# Patient Record
Sex: Female | Born: 1970 | Race: Black or African American | Hispanic: No | Marital: Married | Smoking: Never smoker
Health system: Southern US, Community
[De-identification: ages and names within clinical notes are randomized; demographics above are authoritative.]

## PROBLEM LIST (undated history)

## (undated) DIAGNOSIS — N921 Excessive and frequent menstruation with irregular cycle: Secondary | ICD-10-CM

## (undated) DIAGNOSIS — D259 Leiomyoma of uterus, unspecified: Secondary | ICD-10-CM

## (undated) DIAGNOSIS — D62 Acute posthemorrhagic anemia: Secondary | ICD-10-CM

## (undated) DIAGNOSIS — E119 Type 2 diabetes mellitus without complications: Secondary | ICD-10-CM

## (undated) DIAGNOSIS — D219 Benign neoplasm of connective and other soft tissue, unspecified: Secondary | ICD-10-CM

## (undated) DIAGNOSIS — E785 Hyperlipidemia, unspecified: Secondary | ICD-10-CM

## (undated) DIAGNOSIS — Z9289 Personal history of other medical treatment: Secondary | ICD-10-CM

## (undated) DIAGNOSIS — I10 Essential (primary) hypertension: Secondary | ICD-10-CM

## (undated) DIAGNOSIS — D649 Anemia, unspecified: Secondary | ICD-10-CM

## (undated) HISTORY — DX: Leiomyoma of uterus, unspecified: D25.9

## (undated) HISTORY — DX: Excessive and frequent menstruation with irregular cycle: N92.1

## (undated) HISTORY — DX: Hyperlipidemia, unspecified: E78.5

## (undated) HISTORY — DX: Benign neoplasm of connective and other soft tissue, unspecified: D21.9

## (undated) HISTORY — DX: Essential (primary) hypertension: I10

## (undated) HISTORY — DX: Personal history of other medical treatment: Z92.89

## (undated) HISTORY — DX: Anemia, unspecified: D64.9

## (undated) HISTORY — DX: Type 2 diabetes mellitus without complications: E11.9

## (undated) HISTORY — DX: Acute posthemorrhagic anemia: D62

---

## 2006-11-29 ENCOUNTER — Emergency Department: Payer: Self-pay | Admitting: Emergency Medicine

## 2013-10-06 ENCOUNTER — Inpatient Hospital Stay: Payer: Self-pay | Admitting: Internal Medicine

## 2013-10-06 LAB — URINALYSIS, COMPLETE
Bacteria: NONE SEEN
Bilirubin,UR: NEGATIVE
GLUCOSE, UR: NEGATIVE mg/dL (ref 0–75)
Hyaline Cast: 2
KETONE: NEGATIVE
Leukocyte Esterase: NEGATIVE
Nitrite: NEGATIVE
PH: 6 (ref 4.5–8.0)
Protein: NEGATIVE
RBC,UR: <1 /HPF (ref 0–5)
Specific Gravity: 1.014 (ref 1.003–1.030)

## 2013-10-06 LAB — COMPREHENSIVE METABOLIC PANEL
AST: 15 U/L (ref 15–37)
Albumin: 3.3 g/dL — ABNORMAL LOW (ref 3.4–5.0)
Alkaline Phosphatase: 77 U/L
Anion Gap: 5 — ABNORMAL LOW (ref 7–16)
BUN: 7 mg/dL (ref 7–18)
Bilirubin,Total: 0.4 mg/dL (ref 0.2–1.0)
Calcium, Total: 8.3 mg/dL — ABNORMAL LOW (ref 8.5–10.1)
Chloride: 102 mmol/L (ref 98–107)
Co2: 27 mmol/L (ref 21–32)
Creatinine: 1.05 mg/dL (ref 0.60–1.30)
EGFR (African American): >60
Glucose: 100 mg/dL — ABNORMAL HIGH (ref 65–99)
OSMOLALITY: 266 (ref 275–301)
Potassium: 3.1 mmol/L — ABNORMAL LOW (ref 3.5–5.1)
SGPT (ALT): 13 U/L (ref 12–78)
Sodium: 134 mmol/L — ABNORMAL LOW (ref 136–145)
TOTAL PROTEIN: 8.4 g/dL — AB (ref 6.4–8.2)

## 2013-10-06 LAB — IRON AND TIBC
Iron Bind.Cap.(Total): 430 ug/dL (ref 250–450)
Iron Saturation: 3 %
Iron: 14 ug/dL — ABNORMAL LOW (ref 50–170)
UNBOUND IRON-BIND. CAP.: 416 ug/dL

## 2013-10-06 LAB — CBC WITH DIFFERENTIAL/PLATELET
BASOS ABS: 0.1 10*3/uL (ref 0.0–0.1)
Basophil %: 1.7 %
Eosinophil #: 0 10*3/uL (ref 0.0–0.7)
Eosinophil %: 0.3 %
HCT: 13.3 % — AB (ref 35.0–47.0)
HGB: 3.7 g/dL — AB (ref 12.0–16.0)
LYMPHS PCT: 28.2 %
Lymphocyte #: 1.2 10*3/uL (ref 1.0–3.6)
MCH: 15.4 pg — ABNORMAL LOW (ref 26.0–34.0)
MCHC: 28.1 g/dL — ABNORMAL LOW (ref 32.0–36.0)
MCV: 55 fL — ABNORMAL LOW (ref 80–100)
Monocyte #: 0.6 x10 3/mm (ref 0.2–0.9)
Monocyte %: 12.7 %
NEUTROS PCT: 57.1 %
Neutrophil #: 2.5 10*3/uL (ref 1.4–6.5)
Platelet: 533 10*3/uL — ABNORMAL HIGH (ref 150–440)
RBC: 2.43 10*6/uL — ABNORMAL LOW (ref 3.80–5.20)
RDW: 21.5 % — ABNORMAL HIGH (ref 11.5–14.5)
WBC: 4.4 10*3/uL (ref 3.6–11.0)

## 2013-10-06 LAB — FERRITIN: FERRITIN (ARMC): 2 ng/mL — AB (ref 8–388)

## 2013-10-06 LAB — MAGNESIUM: Magnesium: 1.6 mg/dL — ABNORMAL LOW

## 2013-10-07 LAB — CBC WITH DIFFERENTIAL/PLATELET
BASOS ABS: 0.1 10*3/uL (ref 0.0–0.1)
Basophil #: 0.1 10*3/uL (ref 0.0–0.1)
Basophil %: 2.5 %
Basophil %: 1.5 %
EOS ABS: 0 10*3/uL (ref 0.0–0.7)
Eosinophil #: 0 10*3/uL (ref 0.0–0.7)
Eosinophil %: 0.6 %
Eosinophil %: 0.9 %
HCT: 17 % — AB (ref 35.0–47.0)
HCT: 21.5 % — ABNORMAL LOW (ref 35.0–47.0)
HGB: 5.2 g/dL — ABNORMAL LOW (ref 12.0–16.0)
HGB: 6.5 g/dL — ABNORMAL LOW (ref 12.0–16.0)
LYMPHS PCT: 34.9 %
Lymphocyte #: 1.4 10*3/uL (ref 1.0–3.6)
Lymphocyte #: 1.9 10*3/uL (ref 1.0–3.6)
Lymphocyte %: 38.3 %
MCH: 20.3 pg — AB (ref 26.0–34.0)
MCH: 20.4 pg — ABNORMAL LOW (ref 26.0–34.0)
MCHC: 30.4 g/dL — ABNORMAL LOW (ref 32.0–36.0)
MCHC: 30.5 g/dL — ABNORMAL LOW (ref 32.0–36.0)
MCV: 67 fL — ABNORMAL LOW (ref 80–100)
MCV: 67 fL — ABNORMAL LOW (ref 80–100)
MONO ABS: 0.5 x10 3/mm (ref 0.2–0.9)
MONO ABS: 0.5 x10 3/mm (ref 0.2–0.9)
Monocyte %: 13.6 %
Monocyte %: 9.5 %
NEUTROS ABS: 3 10*3/uL (ref 1.4–6.5)
NEUTROS PCT: 45 %
NEUTROS PCT: 53.2 %
Neutrophil #: 1.6 10*3/uL (ref 1.4–6.5)
Platelet: 460 10*3/uL — ABNORMAL HIGH (ref 150–440)
Platelet: 384 10*3/uL (ref 150–440)
RBC: 2.55 10*6/uL — AB (ref 3.80–5.20)
RBC: 3.21 10*6/uL — ABNORMAL LOW (ref 3.80–5.20)
RDW: 30.5 % — ABNORMAL HIGH (ref 11.5–14.5)
RDW: 32 % — ABNORMAL HIGH (ref 11.5–14.5)
WBC: 3.6 10*3/uL (ref 3.6–11.0)
WBC: 5.6 10*3/uL (ref 3.6–11.0)

## 2013-10-07 LAB — MAGNESIUM: Magnesium: 1.8 mg/dL

## 2013-10-08 LAB — HEMOGLOBIN: HGB: 7.3 g/dL — ABNORMAL LOW (ref 12.0–16.0)

## 2013-10-15 ENCOUNTER — Ambulatory Visit: Payer: Self-pay | Admitting: Internal Medicine

## 2013-10-15 LAB — POTASSIUM: Potassium: 3.8 mmol/L (ref 3.5–5.1)

## 2013-10-15 LAB — CANCER CENTER HEMOGLOBIN: HGB: 10 g/dL — ABNORMAL LOW (ref 12.0–16.0)

## 2013-10-18 LAB — PROT IMMUNOELECTROPHORES(ARMC)

## 2013-10-28 LAB — CANCER CENTER HEMOGLOBIN: HGB: 10.6 g/dL — ABNORMAL LOW (ref 12.0–16.0)

## 2013-10-29 ENCOUNTER — Ambulatory Visit: Payer: Self-pay | Admitting: Internal Medicine

## 2013-11-11 LAB — FERRITIN: FERRITIN (ARMC): 8 ng/mL (ref 8–388)

## 2013-11-11 LAB — HEMOGLOBIN: HGB: 9.9 g/dL — ABNORMAL LOW (ref 12.0–16.0)

## 2013-11-15 LAB — PROT IMMUNOELECTROPHORES(ARMC)

## 2013-11-15 LAB — KAPPA/LAMBDA FREE LIGHT CHAINS (ARMC)

## 2013-11-25 LAB — CBC CANCER CENTER
BASOS PCT: 0.9 %
Basophil #: 0.1 x10 3/mm (ref 0.0–0.1)
EOS ABS: 0.3 x10 3/mm (ref 0.0–0.7)
Eosinophil %: 4.9 %
HCT: 34.8 % — AB (ref 35.0–47.0)
HGB: 11.3 g/dL — ABNORMAL LOW (ref 12.0–16.0)
LYMPHS PCT: 34.5 %
Lymphocyte #: 2.4 x10 3/mm (ref 1.0–3.6)
MCH: 27.6 pg (ref 26.0–34.0)
MCHC: 32.4 g/dL (ref 32.0–36.0)
MCV: 85 fL (ref 80–100)
Monocyte #: 0.4 x10 3/mm (ref 0.2–0.9)
Monocyte %: 5.8 %
NEUTROS PCT: 53.9 %
Neutrophil #: 3.7 x10 3/mm (ref 1.4–6.5)
PLATELETS: 387 x10 3/mm (ref 150–440)
RBC: 4.07 10*6/uL (ref 3.80–5.20)
RDW: 20 % — ABNORMAL HIGH (ref 11.5–14.5)
WBC: 6.8 x10 3/mm (ref 3.6–11.0)

## 2013-11-25 LAB — CALCIUM: Calcium, Total: 8 mg/dL — ABNORMAL LOW (ref 8.5–10.1)

## 2013-11-25 LAB — CREATININE, SERUM
Creatinine: 0.77 mg/dL (ref 0.60–1.30)
EGFR (Non-African Amer.): >60

## 2013-11-29 ENCOUNTER — Ambulatory Visit: Payer: Self-pay | Admitting: Internal Medicine

## 2013-11-29 LAB — PROT IMMUNOELECTROPHORES(ARMC)

## 2013-11-29 LAB — KAPPA/LAMBDA FREE LIGHT CHAINS (ARMC)

## 2013-12-16 LAB — URINALYSIS, COMPLETE
BACTERIA: NONE SEEN
BILIRUBIN, UR: NEGATIVE
BLOOD: NEGATIVE
Glucose,UR: NEGATIVE mg/dL (ref 0–75)
Ketone: NEGATIVE
Leukocyte Esterase: NEGATIVE
Nitrite: NEGATIVE
PH: 6 (ref 4.5–8.0)
Protein: NEGATIVE
Specific Gravity: 1.02 (ref 1.003–1.030)

## 2013-12-16 LAB — OCCULT BLOOD X 1 CARD TO LAB, STOOL
OCCULT BLOOD, FECES: NEGATIVE
Occult Blood, Feces: NEGATIVE
Occult Blood, Feces: NEGATIVE

## 2013-12-16 LAB — CALCIUM: Calcium, Total: 8.8 mg/dL (ref 8.5–10.1)

## 2013-12-16 LAB — HCG, QUANTITATIVE, PREGNANCY: Beta Hcg, Quant.: <1 m[IU]/mL — ABNORMAL LOW

## 2013-12-16 LAB — CANCER CENTER HEMOGLOBIN: HGB: 11.5 g/dL — ABNORMAL LOW (ref 12.0–16.0)

## 2013-12-20 LAB — PROT IMMUNOELECTROPHORES(ARMC)

## 2013-12-29 ENCOUNTER — Ambulatory Visit: Payer: Self-pay | Admitting: Internal Medicine

## 2014-01-05 LAB — CBC CANCER CENTER
BASOS PCT: 1.1 %
Basophil #: 0.1 x10 3/mm (ref 0.0–0.1)
EOS ABS: 0.3 x10 3/mm (ref 0.0–0.7)
Eosinophil %: 3.7 %
HCT: 37.1 % (ref 35.0–47.0)
HGB: 12.1 g/dL (ref 12.0–16.0)
LYMPHS PCT: 32.6 %
Lymphocyte #: 2.6 x10 3/mm (ref 1.0–3.6)
MCH: 28.9 pg (ref 26.0–34.0)
MCHC: 32.5 g/dL (ref 32.0–36.0)
MCV: 89 fL (ref 80–100)
Monocyte #: 0.5 x10 3/mm (ref 0.2–0.9)
Monocyte %: 6 %
Neutrophil #: 4.5 x10 3/mm (ref 1.4–6.5)
Neutrophil %: 56.6 %
Platelet: 337 x10 3/mm (ref 150–440)
RBC: 4.17 10*6/uL (ref 3.80–5.20)
RDW: 14 % (ref 11.5–14.5)
WBC: 8 x10 3/mm (ref 3.6–11.0)

## 2014-01-29 ENCOUNTER — Ambulatory Visit: Payer: Self-pay | Admitting: Internal Medicine

## 2014-03-01 ENCOUNTER — Ambulatory Visit: Payer: Self-pay | Admitting: Internal Medicine

## 2014-06-21 ENCOUNTER — Emergency Department: Payer: Self-pay | Admitting: Emergency Medicine

## 2014-06-29 ENCOUNTER — Ambulatory Visit: Payer: Self-pay | Admitting: Internal Medicine

## 2014-06-29 ENCOUNTER — Emergency Department: Payer: Self-pay | Admitting: Emergency Medicine

## 2014-07-04 ENCOUNTER — Ambulatory Visit: Payer: Self-pay | Admitting: Internal Medicine

## 2014-07-04 LAB — CBC CANCER CENTER
BASOS ABS: 0.1 x10 3/mm (ref 0.0–0.1)
Basophil %: 0.9 %
Eosinophil #: 0.5 x10 3/mm (ref 0.0–0.7)
Eosinophil %: 4.5 %
HCT: 33.4 % — AB (ref 35.0–47.0)
HGB: 10.6 g/dL — AB (ref 12.0–16.0)
Lymphocyte #: 3.5 x10 3/mm (ref 1.0–3.6)
Lymphocyte %: 31.3 %
MCH: 27.7 pg (ref 26.0–34.0)
MCHC: 31.8 g/dL — AB (ref 32.0–36.0)
MCV: 87 fL (ref 80–100)
MONO ABS: 0.7 x10 3/mm (ref 0.2–0.9)
Monocyte %: 6.2 %
NEUTROS ABS: 6.3 x10 3/mm (ref 1.4–6.5)
Neutrophil %: 57.1 %
Platelet: 567 x10 3/mm — ABNORMAL HIGH (ref 150–440)
RBC: 3.84 10*6/uL (ref 3.80–5.20)
RDW: 15.8 % — AB (ref 11.5–14.5)
WBC: 11 x10 3/mm (ref 3.6–11.0)

## 2014-07-04 LAB — CALCIUM: Calcium, Total: 8.8 mg/dL (ref 8.5–10.1)

## 2014-07-04 LAB — CREATININE, SERUM
CREATININE: 0.83 mg/dL (ref 0.60–1.30)
EGFR (African American): >60
EGFR (Non-African Amer.): >60

## 2014-07-05 LAB — KAPPA/LAMBDA FREE LIGHT CHAINS (ARMC)

## 2014-07-05 LAB — PROT IMMUNOELECTROPHORES(ARMC)

## 2014-08-01 ENCOUNTER — Ambulatory Visit: Payer: Self-pay | Admitting: Internal Medicine

## 2014-08-30 ENCOUNTER — Ambulatory Visit
Admit: 2014-08-30 | Disposition: A | Discharge status: Home / Self Care | Payer: Self-pay | Attending: Internal Medicine | Admitting: Internal Medicine

## 2014-09-23 LAB — CBC CANCER CENTER
BASOS PCT: 0.9 %
Basophil #: 0.1 x10 3/mm (ref 0.0–0.1)
EOS PCT: 4.3 %
Eosinophil #: 0.4 x10 3/mm (ref 0.0–0.7)
HCT: 33.2 % — ABNORMAL LOW (ref 35.0–47.0)
HGB: 10.6 g/dL — AB (ref 12.0–16.0)
LYMPHS PCT: 25.8 %
Lymphocyte #: 2.2 x10 3/mm (ref 1.0–3.6)
MCH: 27.8 pg (ref 26.0–34.0)
MCHC: 31.9 g/dL — ABNORMAL LOW (ref 32.0–36.0)
MCV: 87 fL (ref 80–100)
Monocyte #: 0.5 x10 3/mm (ref 0.2–0.9)
Monocyte %: 6.2 %
Neutrophil #: 5.4 x10 3/mm (ref 1.4–6.5)
Neutrophil %: 62.8 %
Platelet: 420 x10 3/mm (ref 150–440)
RBC: 3.81 10*6/uL (ref 3.80–5.20)
RDW: 16.4 % — ABNORMAL HIGH (ref 11.5–14.5)
WBC: 8.5 x10 3/mm (ref 3.6–11.0)

## 2014-09-23 LAB — IRON AND TIBC
IRON BIND. CAP.(TOTAL): 317 (ref 250–450)
IRON SATURATION: 6
Iron: 19 ug/dL — ABNORMAL LOW
UNBOUND IRON-BIND. CAP.: 297.5

## 2014-09-26 LAB — PROT IMMUNOELECTROPHORES(ARMC)

## 2014-09-30 ENCOUNTER — Ambulatory Visit
Admit: 2014-09-30 | Disposition: A | Discharge status: Home / Self Care | Payer: Self-pay | Attending: Internal Medicine | Admitting: Internal Medicine

## 2014-10-22 NOTE — H&P (Signed)
PATIENT NAME:  Barbara Juarez, VENSEL MR#:  [id number] DATE OF BIRTH:  1971/03/24  DATE OF ADMISSION:  10/06/2013  REFERRING PHYSICIAN:  Dr. Thomasene Lot.  PRIMARY CARE PHYSICIAN:  Dr. Burt Ek.   CHIEF COMPLAINT: Nausea, vomiting.   A 44 year old [place] American female without significant past medical history, presenting with nausea and vomiting. She describes a 2 to 3 day duration of nausea and vomiting, nonbloody, nonbilious emesis with associated decreased p.o. intake. She, however, denies any abdominal pain or diarrhea or constipation, change in bowel habits. Denies any bright red blood per rectum or melena. She has had associated nonproductive cough with postnasal drip for the same 2 to 3 day duration. 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 She had presented today as she had worsening of symptoms with associated lightheadedness. When going from sitting to standing, required her to  sit back down. She also has dyspnea on exertion. Denies any further symptoms. Currently without complaints.   REVIEW OF SYSTEMS:  CONSTITUTIONAL: Positive for subjective fevers, chills, fatigue, weakness.  EYES: Denied blurred vision, double vision, eye pain.  EARS, NOSE, THROAT: Denies tinnitus, ear pain. Positive for postnasal drip.  RESPIRATORY: Positive for cough as described above. Denies wheeze or shortness of breath.  CARDIOVASCULAR: Denies chest pain, palpitations, edema.  GASTROINTESTINAL: Denies abdominal pain. Positive for nausea, vomiting. Denies melena, hematochezia.  GENITOURINARY: Denies dysuria or hematuria.  ENDOCRINE: Denies nocturia or thyroid problems.  HEMATOLOGIC AND LYMPHATIC: Denies easy bruising, bleeding or swollen glands.  SKIN: Denies rash or lesion.  MUSCULOSKELETAL: Denies pain in neck, back, shoulder, knees, hips or arthritic symptoms.  NEUROLOGIC: Denies paralysis, paresthesias.  PSYCHIATRIC:  Denies anxiety or depressive symptoms. Otherwise, full review of systems performed by me is negative.   PAST MEDICAL HISTORY: None.   SOCIAL HISTORY: Occasional alcohol. Denies any tobacco or drug usage.   FAMILY HISTORY: Denies any known bleeding disorders or sickle cell.   ALLERGIES: No known drug allergies.   HOME MEDICATIONS: None.   PHYSICAL EXAMINATION: VITAL SIGNS: Temperature 100, heart rate 112, respirations 22, blood pressure 140/61, saturating 99% on supplemental O2. Weight 122.5 kg. BMI 43.6.  GENERAL: Well-nourished, well-developed [place] American female, currently in no acute distress.   HEAD:  Normocephalic, atraumatic. EYES:  Pupils equal, round and reactive to light.  Extraocular muscles intact. No scleral icterus. Pale conjunctivae.  MOUTH: Dry mucosal membranes. Dentition intact. No abscess noted.  EARS, NOSE, THROAT: Clear without exudates. No external lesions.  NECK: Supple. No thyromegaly. No nodules. No JVD.  PULMONARY: Clear to auscultation bilaterally with no wheeze, rubs or rhonchi. No use of accessory muscles. Good respiratory effort.  CHEST: Nontender to palpation.  CARDIOVASCULAR: S1, S2, tachycardic. No murmurs, rubs or gallops. No edema. Pedal pulses 2+ bilaterally.  GASTROINTESTINAL: Soft, nontender, nondistended. No masses. Positive bowel sounds. No hepatosplenomegaly. Fecal occult testing is negative.  MUSCULOSKELETAL: No swelling, clubbing, edema. Range of motion full in all extremities.  NEUROLOGIC: Cranial nerves II through XII intact. No gross focal neurological deficits. Sensation intact.  Reflexes intact.  SKIN: No ulcerations, lesions, rash, cyanosis. Skin warm, dry. Turgor intact.  PSYCHIATRIC: Mood and affect within normal limits. The patient is awake, alert and oriented x 3. Insight and judgment intact.   LABORATORY DATA: Chest x-ray performed revealing no acute cardiopulmonary process. Sodium 134, potassium 3.1, chloride 102, bicarb 27, BUN  7, creatinine 1.05, glucose 100. LFTs:, albumin 3.3; remainder within normal limits.  WBC 4.4, hemoglobin 3.7, platelets of 533, MCV 55, RDW 21.5. Urinalysis is negative for evidence of infection.   ASSESSMENT AND PLAN: A 44 year old African American female without significant history presenting with nausea and vomiting.  1. Symptomatic microcytic anemia with hemoglobin of 3.7, MCV of 55, RDW of 21. We will check iron studies. I suspect this is likely related iron deficiency anemia, as she does state she has heavy menstrual periods lasting 5 to 7 days with frank blood clots and going through multiple tampons daily, too many to count.  Regardless, we will transfuse 2 units now as ordered by the Emergency Department, and check repeat CBC after the transfusion and repeat transfusions with goal hemoglobin greater than 7.   2.  Systemic inflammatory response syndrome, meeting SIRS criteria by tachycardia and temperature. No gross evidence of bacterial infections at this time. I suspect this is likely viral in nature. We will hold on any antibiotics and provide supportive care including IV fluids and Tylenol as required.  3.  Gastroenteritis. Supportive care, IV fluid and Zofran as required.  4.  Hypokalemia. Replace potassium to goal of 4 to 5.  5.  Venous thromboembolism prophylaxis with sequential compression devices.   The patient is FULL CODE.   TIME SPENT: 45 minutes.    ____________________________ Aaron Mose. Hower, MD dkh:dmm D: 10/06/2013 21:31:40 ET T: 10/06/2013 22:06:44 ET JOB#: [id number]  cc: Aaron Mose. Hower, MD, <Dictator> DAVID Woodfin Ganja MD ELECTRONICALLY SIGNED 10/07/2013 13:47

## 2014-10-22 NOTE — Discharge Summary (Signed)
PATIENT NAME:  Barbara Juarez, OZGA MR#:  [id number] DATE OF BIRTH:  [birth year]  DATE OF ADMISSION:  10/06/2013  DATE OF DISCHARGE:  10/08/2013  ADMISSION DIAGNOSIS: Symptomatic anemia.   DISCHARGE DIAGNOSES:  1.  Symptomatic anemia, most likely secondary to heavy menses.  2.  Viral gastroenteritis with nausea, vomiting and fever.   CONSULTATIONS: None.   PERTINENT LABORATORIES AT DISCHARGE:  Hemoglobin of 7.3 Admission hemoglobin 3.7.   TIBC 430, UIBC 416, iron 14, saturation 3, ferritin 2, magnesium 1.6.   HOSPITAL COURSE:  This is a 44 year old female who is admitted for symptomatic anemia. She was found to have a hemoglobin of 3. For further details, please see H and P.   ASSESSMENT AND PLAN: 1.  Symptomatic anemia, suspected from heavy menses as per the history of the patient. She has iron studies consistent with iron deficiency anemia. She received 5units of PRBCs total during this hospitalization and recommend iron supplementation. Referral to OB-GYN and hematology as an outpatient.  2.  Tachycardia and fever on admission from viral gastroenteritis and anemia.  3.  Gastroenteritis. The patient was on supportive care.  4. enlarged heart on CXR she needs outpatietn follow up for this.  DISCHARGE MEDICATIONS:  Ferrous sulfate 325 mg b.i.d.   DISCHARGE DIET: Regular.   DISCHARGE ACTIVITY: As tolerated.   FOLLOW UP:  Patient will follow up with [facility] in 1-2 weeks as well as oncology.   TIME SPENT: Approximately 35 minutes.   DISPOSITION: The patient medically stable for discharge.    ____________________________ Florice Hindle P. Benjie Karvonen, MD spm:tc D: 10/07/2013 14:22:13 ET T: 10/07/2013 14:42:22 ET JOB#: [id number]  cc: Celestine Prim P. Benjie Karvonen, MD, <Dictator>            [facility] Zara Wendt P Analyah Mcconnon MD ELECTRONICALLY SIGNED 10/08/2013 12:46

## 2014-12-21 ENCOUNTER — Inpatient Hospital Stay: Payer: Self-pay | Attending: Family Medicine

## 2014-12-21 ENCOUNTER — Inpatient Hospital Stay: Payer: Self-pay | Admitting: Family Medicine

## 2015-02-03 ENCOUNTER — Other Ambulatory Visit: Payer: Self-pay | Admitting: Family Medicine

## 2015-02-08 ENCOUNTER — Other Ambulatory Visit: Payer: Self-pay

## 2015-02-08 DIAGNOSIS — D500 Iron deficiency anemia secondary to blood loss (chronic): Secondary | ICD-10-CM | POA: Insufficient documentation

## 2015-02-08 DIAGNOSIS — D649 Anemia, unspecified: Secondary | ICD-10-CM

## 2015-02-09 ENCOUNTER — Inpatient Hospital Stay: Payer: Self-pay

## 2015-02-09 ENCOUNTER — Inpatient Hospital Stay: Payer: Self-pay | Admitting: Family Medicine

## 2015-02-13 ENCOUNTER — Other Ambulatory Visit: Payer: Self-pay | Admitting: Family Medicine

## 2015-02-13 DIAGNOSIS — D509 Iron deficiency anemia, unspecified: Secondary | ICD-10-CM

## 2015-02-22 ENCOUNTER — Inpatient Hospital Stay: Payer: Self-pay

## 2015-02-22 ENCOUNTER — Inpatient Hospital Stay: Payer: Self-pay | Attending: Family Medicine | Admitting: Family Medicine

## 2015-02-22 ENCOUNTER — Other Ambulatory Visit: Payer: Self-pay | Admitting: Family Medicine

## 2015-02-22 DIAGNOSIS — D472 Monoclonal gammopathy: Secondary | ICD-10-CM | POA: Insufficient documentation

## 2015-02-22 DIAGNOSIS — D509 Iron deficiency anemia, unspecified: Secondary | ICD-10-CM

## 2015-02-22 DIAGNOSIS — Z79899 Other long term (current) drug therapy: Secondary | ICD-10-CM | POA: Insufficient documentation

## 2015-02-22 DIAGNOSIS — N946 Dysmenorrhea, unspecified: Secondary | ICD-10-CM | POA: Insufficient documentation

## 2015-02-22 DIAGNOSIS — D649 Anemia, unspecified: Secondary | ICD-10-CM

## 2015-02-22 LAB — CBC
HCT: 30.1 % — ABNORMAL LOW (ref 35.0–47.0)
HEMOGLOBIN: 9.2 g/dL — AB (ref 12.0–16.0)
MCH: 24.3 pg — ABNORMAL LOW (ref 26.0–34.0)
MCHC: 30.7 g/dL — ABNORMAL LOW (ref 32.0–36.0)
MCV: 79.2 fL — ABNORMAL LOW (ref 80.0–100.0)
Platelets: 514 10*3/uL — ABNORMAL HIGH (ref 150–440)
RBC: 3.8 MIL/uL (ref 3.80–5.20)
RDW: 18.3 % — AB (ref 11.5–14.5)
WBC: 7.8 10*3/uL (ref 3.6–11.0)

## 2015-02-22 LAB — IRON AND TIBC
IRON: 16 ug/dL — AB (ref 28–170)
Saturation Ratios: 4 % — ABNORMAL LOW (ref 10.4–31.8)
TIBC: 361 ug/dL (ref 250–450)
UIBC: 345 ug/dL

## 2015-02-22 LAB — FERRITIN: FERRITIN: 8 ng/mL — AB (ref 11–307)

## 2015-02-22 NOTE — Progress Notes (Signed)
Patient here today for follow up for anemia. Former patient of Dr. Inez Pilgrim.  Offers no complaints today.

## 2015-02-23 ENCOUNTER — Other Ambulatory Visit: Payer: Self-pay | Admitting: Family Medicine

## 2015-02-23 LAB — PROTEIN ELECTROPHORESIS, SERUM
A/G Ratio: 0.9 (ref 0.7–1.7)
ALPHA-1-GLOBULIN: 0.2 g/dL (ref 0.0–0.4)
Albumin ELP: 3.5 g/dL (ref 2.9–4.4)
Alpha-2-Globulin: 0.6 g/dL (ref 0.4–1.0)
BETA GLOBULIN: 1.3 g/dL (ref 0.7–1.3)
GAMMA GLOBULIN: 1.9 g/dL — AB (ref 0.4–1.8)
Globulin, Total: 4 g/dL — ABNORMAL HIGH (ref 2.2–3.9)
M-Spike, %: 0.8 g/dL — ABNORMAL HIGH
TOTAL PROTEIN ELP: 7.5 g/dL (ref 6.0–8.5)

## 2015-02-23 NOTE — Progress Notes (Signed)
Barbara Juarez  Telephone:([phone]) [phone]  Fax:(336) [place] DOB: 04/25/1971  MR#: [id number]  OQH#:[id number]  Date of service 02/22/2015.  Patient Care Team: Shepard General, MD as PCP - General (General Practice)  CHIEF COMPLAINT:  Chief Complaint  Patient presents with  . Follow-up    Patient with significant past medical history of iron deficiency anemia, mild cardiomegaly, pulmonary congestion, and a monoclonal gammopathy.  INTERVAL HISTORY: Patient is here for further evaluation and treatment consideration regarding iron deficiency anemia. She reports continuing with her ferrous sulfate 3 times per day orally. The patient was initially evaluated in April following recent hospitalization. She was a previous patient of Dr. Marylene Land. She is established and followed by Dolores Frame for dysmenorrhea. She reports most recent menses was 2 weeks ago and was very heavy. She overall feels well and denies any acute complaints today.  REVIEW OF SYSTEMS:   Review of Systems  Constitutional: Negative for fever, chills, weight loss, malaise/fatigue and diaphoresis.  HENT: Negative for congestion, ear discharge, ear pain, hearing loss, nosebleeds, sore throat and tinnitus.   Eyes: Negative for blurred vision, double vision, photophobia, pain, discharge and redness.  Respiratory: Negative for cough, hemoptysis, sputum production, shortness of breath, wheezing and stridor.   Cardiovascular: Negative for chest pain, palpitations, orthopnea, claudication, leg swelling and PND.  Gastrointestinal: Negative for heartburn, nausea, vomiting, abdominal pain, diarrhea, constipation, blood in stool and melena.  Genitourinary: Negative.   Musculoskeletal: Negative.   Skin: Negative.   Neurological: Negative for dizziness, tingling, focal weakness, seizures, weakness and headaches.  Endo/Heme/Allergies: Does not bruise/bleed easily.  Psychiatric/Behavioral: Negative for  depression. The patient is not nervous/anxious and does not have insomnia.     As per HPI. Otherwise, a complete review of systems is negatve.  ONCOLOGY HISTORY:  No history exists.    PAST MEDICAL HISTORY: No past medical history on file.  PAST SURGICAL HISTORY: No past surgical history on file.  FAMILY HISTORY No family history on file.  GYNECOLOGIC HISTORY:  No LMP recorded.     ADVANCED DIRECTIVES:    HEALTH MAINTENANCE: Social History  Substance Use Topics  . Smoking status: Not on file  . Smokeless tobacco: Not on file  . Alcohol Use: Not on file     Colonoscopy:  PAP:  Bone density:  Lipid panel:  No Known Allergies  Current Outpatient Prescriptions  Medication Sig Dispense Refill  . ferrous sulfate 325 (65 FE) MG EC tablet Take 325 mg by mouth 3 (three) times daily with meals.     No current facility-administered medications for this visit.    OBJECTIVE: BP 153/99 mmHg  Pulse 83  Temp(Src) 96.8 F (36 C) (Tympanic)  Wt 293 lb 6.9 oz (133.1 kg)   There is no height on file to calculate BMI.    ECOG FS:0 - Asymptomatic  General: Well-developed, well-nourished, no acute distress. Eyes: Pink conjunctiva, anicteric sclera. HEENT: Normocephalic, moist mucous membranes, clear oropharnyx. Lungs: Clear to auscultation bilaterally. Heart: Regular rate and rhythm. No rubs, murmurs, or gallops. Musculoskeletal: No edema, cyanosis, or clubbing. Neuro: Alert, answering all questions appropriately. Cranial nerves grossly intact. Skin: No rashes or petechiae noted. Psych: Normal affect.    LAB RESULTS:  Office Visit on 02/22/2015  Component Date Value Ref Range Status  . Ferritin 02/22/2015 8* 11 - 307 ng/mL Final  . Iron 02/22/2015 16* 28 - 170 ug/dL Final  . TIBC 02/22/2015 361  250 - 450 ug/dL Final  .  Saturation Ratios 02/22/2015 4* 10.4 - 31.8 % Final  . UIBC 02/22/2015 345   Final  Appointment on 02/22/2015  Component Date Value Ref Range  Status  . WBC 02/22/2015 7.8  3.6 - 11.0 K/uL Final  . RBC 02/22/2015 3.80  3.80 - 5.20 MIL/uL Final  . Hemoglobin 02/22/2015 9.2* 12.0 - 16.0 g/dL Final  . HCT 02/22/2015 30.1* 35.0 - 47.0 % Final  . MCV 02/22/2015 79.2* 80.0 - 100.0 fL Final  . MCH 02/22/2015 24.3* 26.0 - 34.0 pg Final  . MCHC 02/22/2015 30.7* 32.0 - 36.0 g/dL Final  . RDW 02/22/2015 18.3* 11.5 - 14.5 % Final  . Platelets 02/22/2015 514* 150 - 440 K/uL Final    STUDIES: No results found.  ASSESSMENT:  Iron deficiency anemia. Monoclonal gammopathy.  PLAN:  1. IDA. Patient continues with oral iron 3 times per day. Labs reported today with a low ferritin as well as low iron. Hemoglobin 9.2. Discussed with patient and she is agreeable to receiving IV Feraheme. This has been scheduled for 2 doses one week apart. We will have patient return in approximately 8 weeks for labs and continued follow-up. She has been instructed to continue with oral iron as previously scheduled. 2. Monoclonal gammopathy. Most recent SIEP was evaluated in March 2016. Patient was noted to have an M spike of 800. Previously noted M spike of 900 mg. Overall readings are stable, We'll continue to monitor.   Patient expressed understanding and was in agreement with this plan. She also understands that She can call clinic at any time with any questions, concerns, or complaints.   Dr.Finnegan  was available for consultation and review of plan of care for this patient.   Evlyn Kanner, NP   02/23/2015 2:40 PM

## 2015-05-24 ENCOUNTER — Ambulatory Visit: Payer: Self-pay

## 2015-05-24 ENCOUNTER — Other Ambulatory Visit: Payer: Self-pay

## 2015-05-30 ENCOUNTER — Inpatient Hospital Stay: Payer: Self-pay | Attending: Internal Medicine | Admitting: Internal Medicine

## 2015-05-30 ENCOUNTER — Inpatient Hospital Stay: Payer: Self-pay

## 2015-05-30 VITALS — BP 154/108 | Temp 97.2°F | Resp 88 | Ht 62.0 in | Wt 294.5 lb

## 2015-05-30 DIAGNOSIS — D472 Monoclonal gammopathy: Secondary | ICD-10-CM | POA: Insufficient documentation

## 2015-05-30 DIAGNOSIS — D509 Iron deficiency anemia, unspecified: Secondary | ICD-10-CM

## 2015-05-30 DIAGNOSIS — R0989 Other specified symptoms and signs involving the circulatory and respiratory systems: Secondary | ICD-10-CM | POA: Insufficient documentation

## 2015-05-30 DIAGNOSIS — Z1239 Encounter for other screening for malignant neoplasm of breast: Secondary | ICD-10-CM

## 2015-05-30 DIAGNOSIS — Z79899 Other long term (current) drug therapy: Secondary | ICD-10-CM | POA: Insufficient documentation

## 2015-05-30 LAB — CBC WITH DIFFERENTIAL/PLATELET
Basophils Absolute: 0.1 10*3/uL (ref 0–0.1)
Basophils Relative: 1 %
EOS PCT: 5 %
Eosinophils Absolute: 0.5 10*3/uL (ref 0–0.7)
HEMATOCRIT: 27.3 % — AB (ref 35.0–47.0)
HEMOGLOBIN: 8.3 g/dL — AB (ref 12.0–16.0)
LYMPHS ABS: 2.4 10*3/uL (ref 1.0–3.6)
LYMPHS PCT: 26 %
MCH: 23.5 pg — AB (ref 26.0–34.0)
MCHC: 30.4 g/dL — ABNORMAL LOW (ref 32.0–36.0)
MCV: 77.3 fL — AB (ref 80.0–100.0)
Monocytes Absolute: 0.5 10*3/uL (ref 0.2–0.9)
Monocytes Relative: 5 %
NEUTROS ABS: 6 10*3/uL (ref 1.4–6.5)
Neutrophils Relative %: 63 %
PLATELETS: 602 10*3/uL — AB (ref 150–440)
RBC: 3.53 MIL/uL — AB (ref 3.80–5.20)
RDW: 22.1 % — ABNORMAL HIGH (ref 11.5–14.5)
WBC: 9.5 10*3/uL (ref 3.6–11.0)

## 2015-05-30 LAB — IRON AND TIBC
IRON: 71 ug/dL (ref 28–170)
Saturation Ratios: 22 % (ref 10.4–31.8)
TIBC: 317 ug/dL (ref 250–450)
UIBC: 246 ug/dL

## 2015-05-30 LAB — FERRITIN: Ferritin: 10 ng/mL — ABNORMAL LOW (ref 11–307)

## 2015-05-30 NOTE — Progress Notes (Signed)
[place]  Telephone:([phone]  Fax:([phone]) [place] DOB: 06/22/1971  MR#: [id number]  [id number]  Date of service 02/22/2015.  Patient Care Team: Shepard General, MD as PCP - General (General Practice)  CHIEF COMPLAINT:  Chief Complaint  Patient presents with  . IDA    Patient with significant past medical history of iron deficiency anemia, mild cardiomegaly, pulmonary congestion, and a monoclonal gammopathy.  INTERVAL HISTORY: Barbara Juarez  returns to our clinic for a follow-up visit. She continues to have very heavy menstrual bleedings, with most recent one last in 7 days, she had to use 36 tampons over the this period of time. Menstrual bleeding resolved 2 days ago. She claims to be compliant with iron supplementation, which he takes 3 times a day without any significant GI disturbance.  REVIEW OF SYSTEMS:   Review of Systems  Constitutional: Negative for fever, chills, weight loss, malaise/fatigue and diaphoresis.  HENT: Negative for congestion, ear discharge, ear pain, hearing loss, nosebleeds, sore throat and tinnitus.   Eyes: Negative for blurred vision, double vision, photophobia, pain, discharge and redness.  Respiratory: Negative for cough, hemoptysis, sputum production, shortness of breath, wheezing and stridor.   Cardiovascular: Negative for chest pain, palpitations, orthopnea, claudication, leg swelling and PND.  Gastrointestinal: Negative for heartburn, nausea, vomiting, abdominal pain, diarrhea, constipation, blood in stool and melena.  Genitourinary: Negative.   Musculoskeletal: Negative.   Skin: Negative.   Neurological: Negative for dizziness, tingling, focal weakness, seizures, weakness and headaches.  Endo/Heme/Allergies: Does not bruise/bleed easily.  Psychiatric/Behavioral: Negative for depression. The patient is not nervous/anxious and does not have insomnia.     As per HPI. Otherwise, a complete review of  systems is negatve.  ONCOLOGY HISTORY:  No history exists.    PAST MEDICAL HISTORY: History reviewed. No pertinent past medical history.  PAST SURGICAL HISTORY: History reviewed. No pertinent past surgical history.  FAMILY HISTORY Family History  Problem Relation Age of Onset  . Diabetes Mother     GYNECOLOGIC HISTORY:  No LMP recorded.       HEALTH MAINTENANCE: Social History  Substance Use Topics  . Smoking status: Never Smoker   . Smokeless tobacco: None  . Alcohol Use: None     Colonoscopy:  PAP:  Bone density:  Lipid panel:  No Known Allergies  Current Outpatient Prescriptions  Medication Sig Dispense Refill  . ferrous sulfate 325 (65 FE) MG EC tablet Take 325 mg by mouth 3 (three) times daily with meals.     No current facility-administered medications for this visit.    OBJECTIVE: BP 154/108 mmHg  Temp(Src) 97.2 F (36.2 C) (Tympanic)  Resp 88  Ht 5\' 2"  (1.575 m)  Wt 294 lb 8.6 oz (133.6 kg)  BMI 53.86 kg/m2   Body mass index is 53.86 kg/(m^2).    ECOG FS:0 - Asymptomatic  General: Morbidly obese African-American female no acute distress. Eyes: Pink conjunctiva, anicteric sclera. HEENT: Normocephalic, moist mucous membranes, clear oropharnyx. Lungs: Clear to auscultation bilaterally. Heart: Regular rate and rhythm. No rubs, murmurs, or gallops. Musculoskeletal: No edema, cyanosis, or clubbing. Neuro: Alert, answering all questions appropriately. Cranial nerves grossly intact. Skin: No rashes or petechiae noted. Psych: Normal affect.    LAB RESULTS:  Appointment on 05/30/2015  Component Date Value Ref Range Status  . Ferritin 05/30/2015 10* 11 - 307 ng/mL Final  . Iron 05/30/2015 71  28 - 170 ug/dL Final  . TIBC 05/30/2015 317  250 -  450 ug/dL Final  . Saturation Ratios 05/30/2015 22  10.4 - 31.8 % Final  . UIBC 05/30/2015 246   Final  . WBC 05/30/2015 9.5  3.6 - 11.0 K/uL Final  . RBC 05/30/2015 3.53* 3.80 - 5.20 MIL/uL Final  .  Hemoglobin 05/30/2015 8.3* 12.0 - 16.0 g/dL Final  . HCT 05/30/2015 27.3* 35.0 - 47.0 % Final  . MCV 05/30/2015 77.3* 80.0 - 100.0 fL Final  . MCH 05/30/2015 23.5* 26.0 - 34.0 pg Final  . MCHC 05/30/2015 30.4* 32.0 - 36.0 g/dL Final  . RDW 05/30/2015 22.1* 11.5 - 14.5 % Final  . Platelets 05/30/2015 602* 150 - 440 K/uL Final  . Neutrophils Relative % 05/30/2015 63   Final  . Neutro Abs 05/30/2015 6.0  1.4 - 6.5 K/uL Final  . Lymphocytes Relative 05/30/2015 26   Final  . Lymphs Abs 05/30/2015 2.4  1.0 - 3.6 K/uL Final  . Monocytes Relative 05/30/2015 5   Final  . Monocytes Absolute 05/30/2015 0.5  0.2 - 0.9 K/uL Final  . Eosinophils Relative 05/30/2015 5   Final  . Eosinophils Absolute 05/30/2015 0.5  0 - 0.7 K/uL Final  . Basophils Relative 05/30/2015 1   Final  . Basophils Absolute 05/30/2015 0.1  0 - 0.1 K/uL Final    STUDIES: No results found.  ASSESSMENT:  Iron deficiency anemia. Monoclonal gammopathy.  PLAN:  1. IDA.- Secondary to heavy menstrual bleeding. She appears to be compliant with oral iron supplementation, however, this does not appear to be enough to compensate for losses. We will refer her to our GYN specialists to decide on the need for oral contraceptive pills or other interventions to control menstrual bleeding. We will also plan on administering IV iron in the form of Feraheme or the next 2 weeks. She will return to our clinic in 1 month's time to follow-up on the CBC and ferritin.  2. Monoclonal gammopathy. Most recent SIEP was evaluated in March 2016. Patient was noted to have an M spike of 800. Previously noted M spike of 900 mg. Overall readings are stable, We'll continue to monitor on an annual basis.   3. Mammogram-she has not had a mammogram in 2 years, so she will be referred for the screening mammography.  Patient expressed understanding and was in agreement with this plan. She also understands that She can call clinic at any time with any questions,  concerns, or complaints.     Roxana Hires, MD   05/30/2015 1:28 PM

## 2015-05-30 NOTE — Progress Notes (Signed)
Patient is here for 3 month follow-up of IDA. Patient states that she has been taking the ferrous sulfate 3 x day and has had no problems with it.

## 2015-05-30 NOTE — Addendum Note (Signed)
Addended by: Luella Cook on: 05/30/2015 01:57 PM   Modules accepted: Orders

## 2015-05-31 ENCOUNTER — Other Ambulatory Visit: Payer: Self-pay | Admitting: Family Medicine

## 2015-05-31 LAB — PROTEIN ELECTROPHORESIS, SERUM
A/G RATIO SPE: 1 (ref 0.7–1.7)
ALBUMIN ELP: 3.5 g/dL (ref 2.9–4.4)
ALPHA-1-GLOBULIN: 0.2 g/dL (ref 0.0–0.4)
ALPHA-2-GLOBULIN: 0.6 g/dL (ref 0.4–1.0)
BETA GLOBULIN: 1.1 g/dL (ref 0.7–1.3)
GLOBULIN, TOTAL: 3.6 g/dL (ref 2.2–3.9)
Gamma Globulin: 1.7 g/dL (ref 0.4–1.8)
M-Spike, %: 0.8 g/dL — ABNORMAL HIGH
Total Protein ELP: 7.1 g/dL (ref 6.0–8.5)

## 2015-06-01 ENCOUNTER — Inpatient Hospital Stay: Payer: Self-pay | Attending: Internal Medicine

## 2015-06-01 DIAGNOSIS — D472 Monoclonal gammopathy: Secondary | ICD-10-CM | POA: Insufficient documentation

## 2015-06-01 DIAGNOSIS — Z79899 Other long term (current) drug therapy: Secondary | ICD-10-CM | POA: Insufficient documentation

## 2015-06-01 DIAGNOSIS — D509 Iron deficiency anemia, unspecified: Secondary | ICD-10-CM | POA: Insufficient documentation

## 2015-06-01 MED ORDER — SODIUM CHLORIDE 0.9 % IV SOLN
510.0000 mg | Freq: Once | INTRAVENOUS | Status: AC
  Administered 2015-06-01: 510 mg via INTRAVENOUS
  Filled 2015-06-01: qty 17

## 2015-06-01 MED ORDER — SODIUM CHLORIDE 0.9 % IV SOLN
Freq: Once | INTRAVENOUS | Status: AC
  Administered 2015-06-01: 09:00:00 via INTRAVENOUS
  Filled 2015-06-01: qty 1000

## 2015-06-08 ENCOUNTER — Inpatient Hospital Stay: Payer: Self-pay

## 2015-06-08 VITALS — BP 139/84 | HR 71 | Temp 96.2°F | Resp 20

## 2015-06-08 DIAGNOSIS — D509 Iron deficiency anemia, unspecified: Secondary | ICD-10-CM

## 2015-06-08 MED ORDER — SODIUM CHLORIDE 0.9 % IV SOLN
Freq: Once | INTRAVENOUS | Status: AC
  Administered 2015-06-08: 14:00:00 via INTRAVENOUS
  Filled 2015-06-08: qty 1000

## 2015-06-08 MED ORDER — SODIUM CHLORIDE 0.9 % IV SOLN
510.0000 mg | Freq: Once | INTRAVENOUS | Status: AC
  Administered 2015-06-08: 510 mg via INTRAVENOUS
  Filled 2015-06-08: qty 17

## 2015-06-30 ENCOUNTER — Ambulatory Visit: Payer: Self-pay

## 2015-06-30 ENCOUNTER — Other Ambulatory Visit: Payer: Self-pay

## 2015-07-04 ENCOUNTER — Other Ambulatory Visit: Payer: Self-pay

## 2015-07-04 ENCOUNTER — Ambulatory Visit: Payer: Self-pay

## 2015-07-06 ENCOUNTER — Ambulatory Visit: Payer: Self-pay

## 2015-07-12 ENCOUNTER — Inpatient Hospital Stay: Payer: Self-pay

## 2015-07-12 IMAGING — CR DG CHEST 2V
1 series · 2 of 2 positions shown · non-contrast
Comparison: None.

CLINICAL DATA: Fever and cough.

EXAM:
CHEST  2 VIEW

[Series 1: w chest pa · 0.14mm/px · 2 of 2 slices shown]
[im 1/2]
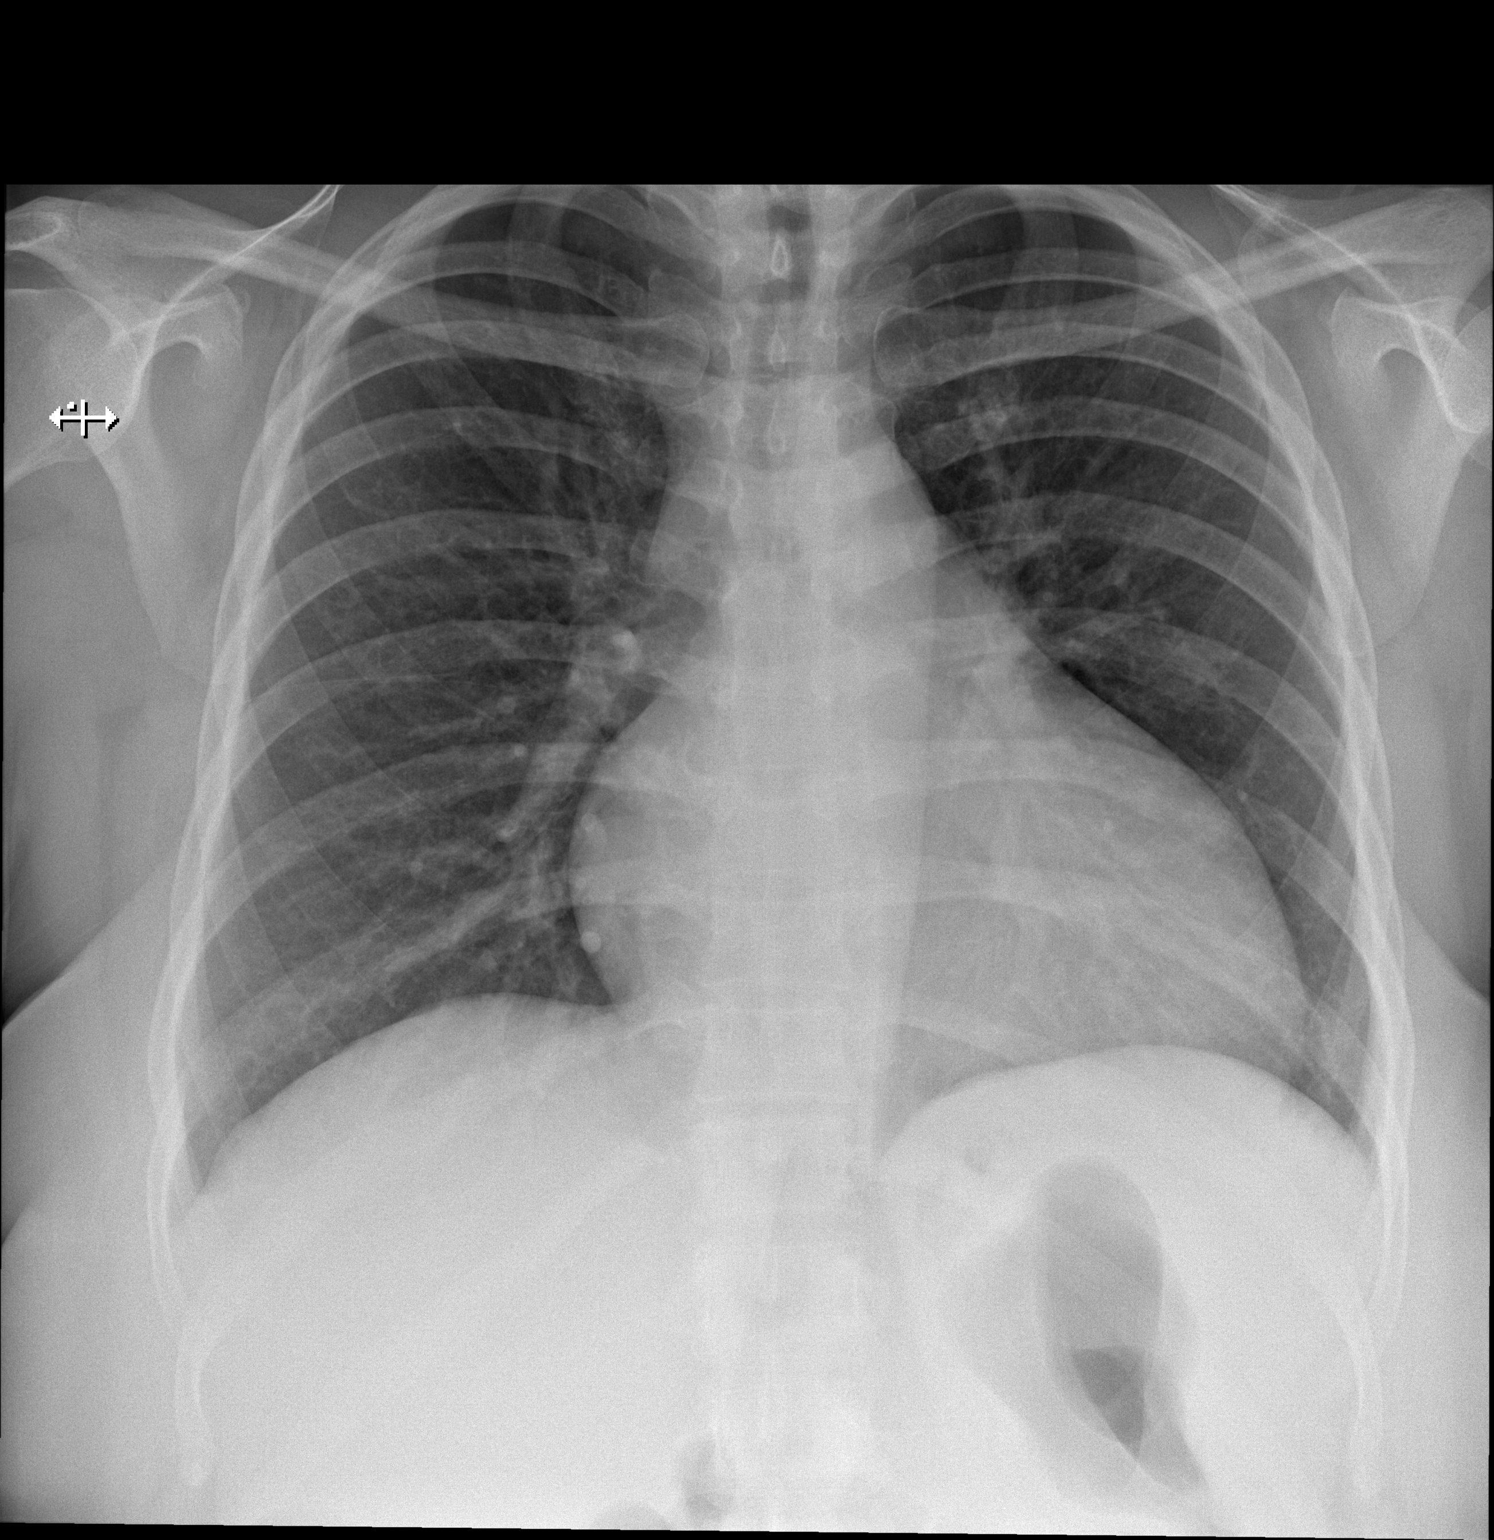
[im 2/2]
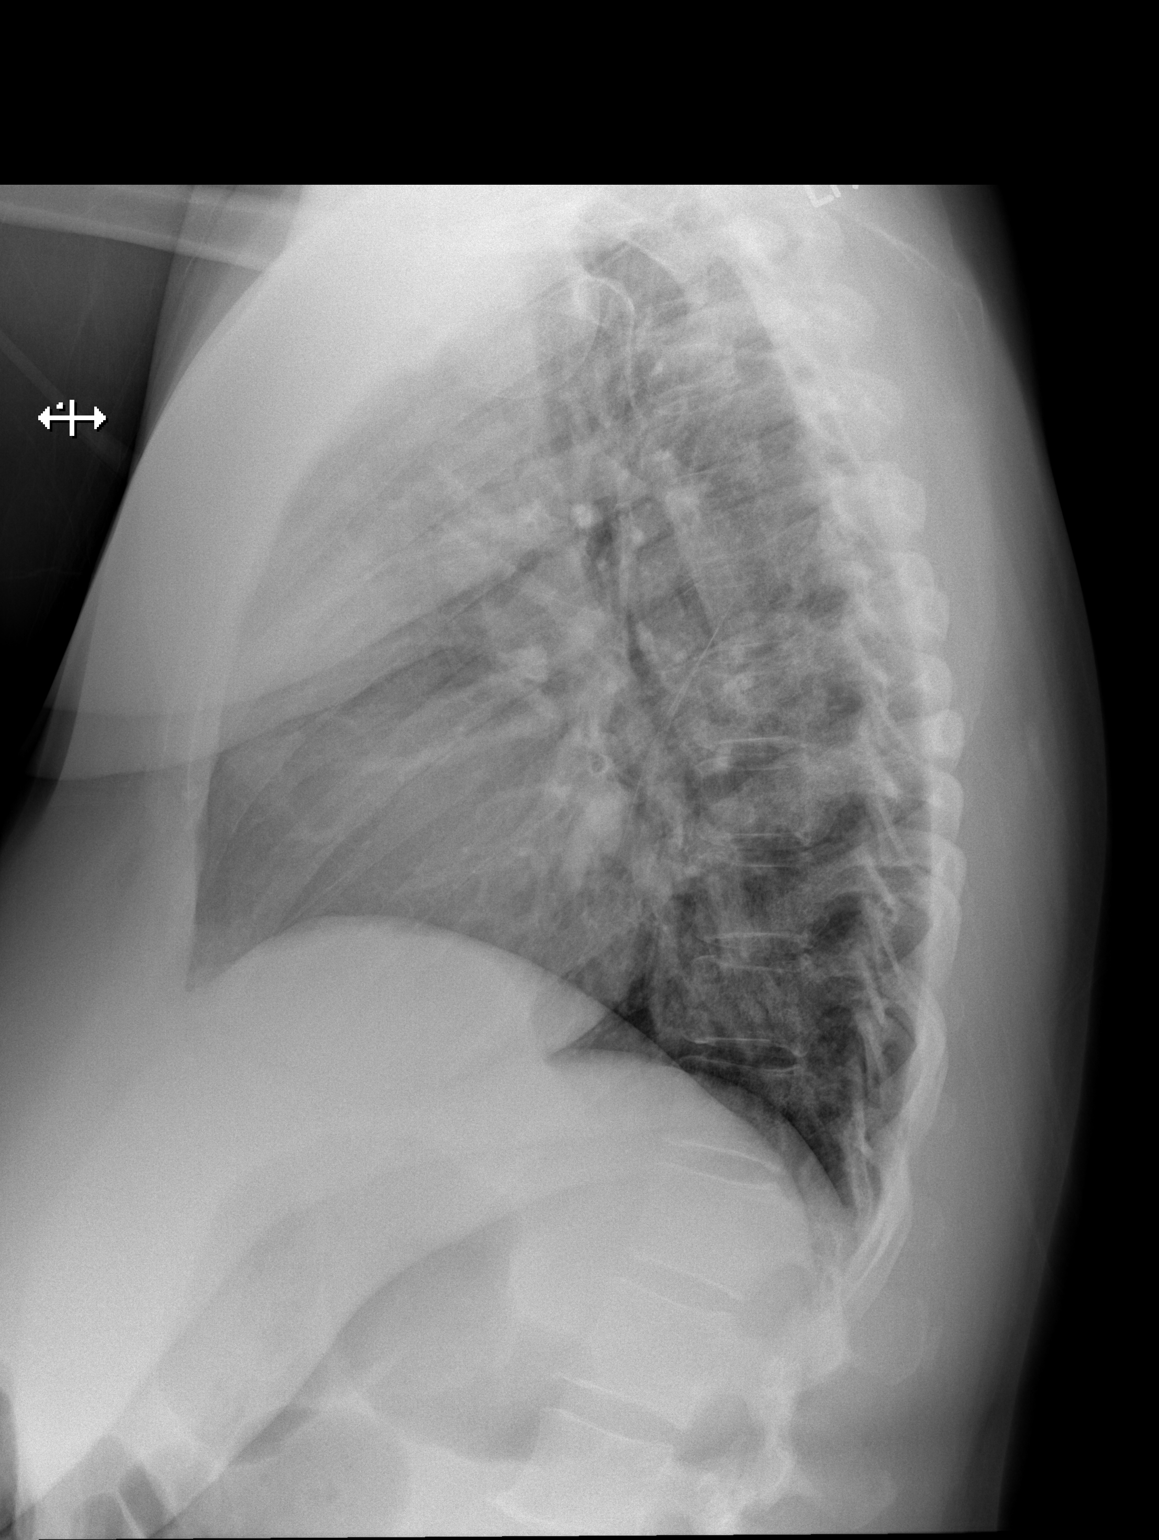

[2 of 2 positions shown; findings below may reference images not displayed]

FINDINGS: Mild cardiac enlargement. The mediastinal and hilar contours are
normal. The lungs are clear. No pleural effusion. The bony thorax is
intact.
IMPRESSION: Cardiac enlargement for age. Could not exclude the possibility of a
pericardial effusion.

No acute pulmonary findings.

## 2015-07-14 IMAGING — CR DG CHEST 2V
1 series · 2 of 2 positions shown · non-contrast
Comparison: Two-view chest [DATE].

CLINICAL DATA: Hypoxia and anemia.

EXAM:
CHEST  2 VIEW

[Series 3: w chest pa · 0.14mm/px · 2 of 2 slices shown]
[im 1/2]
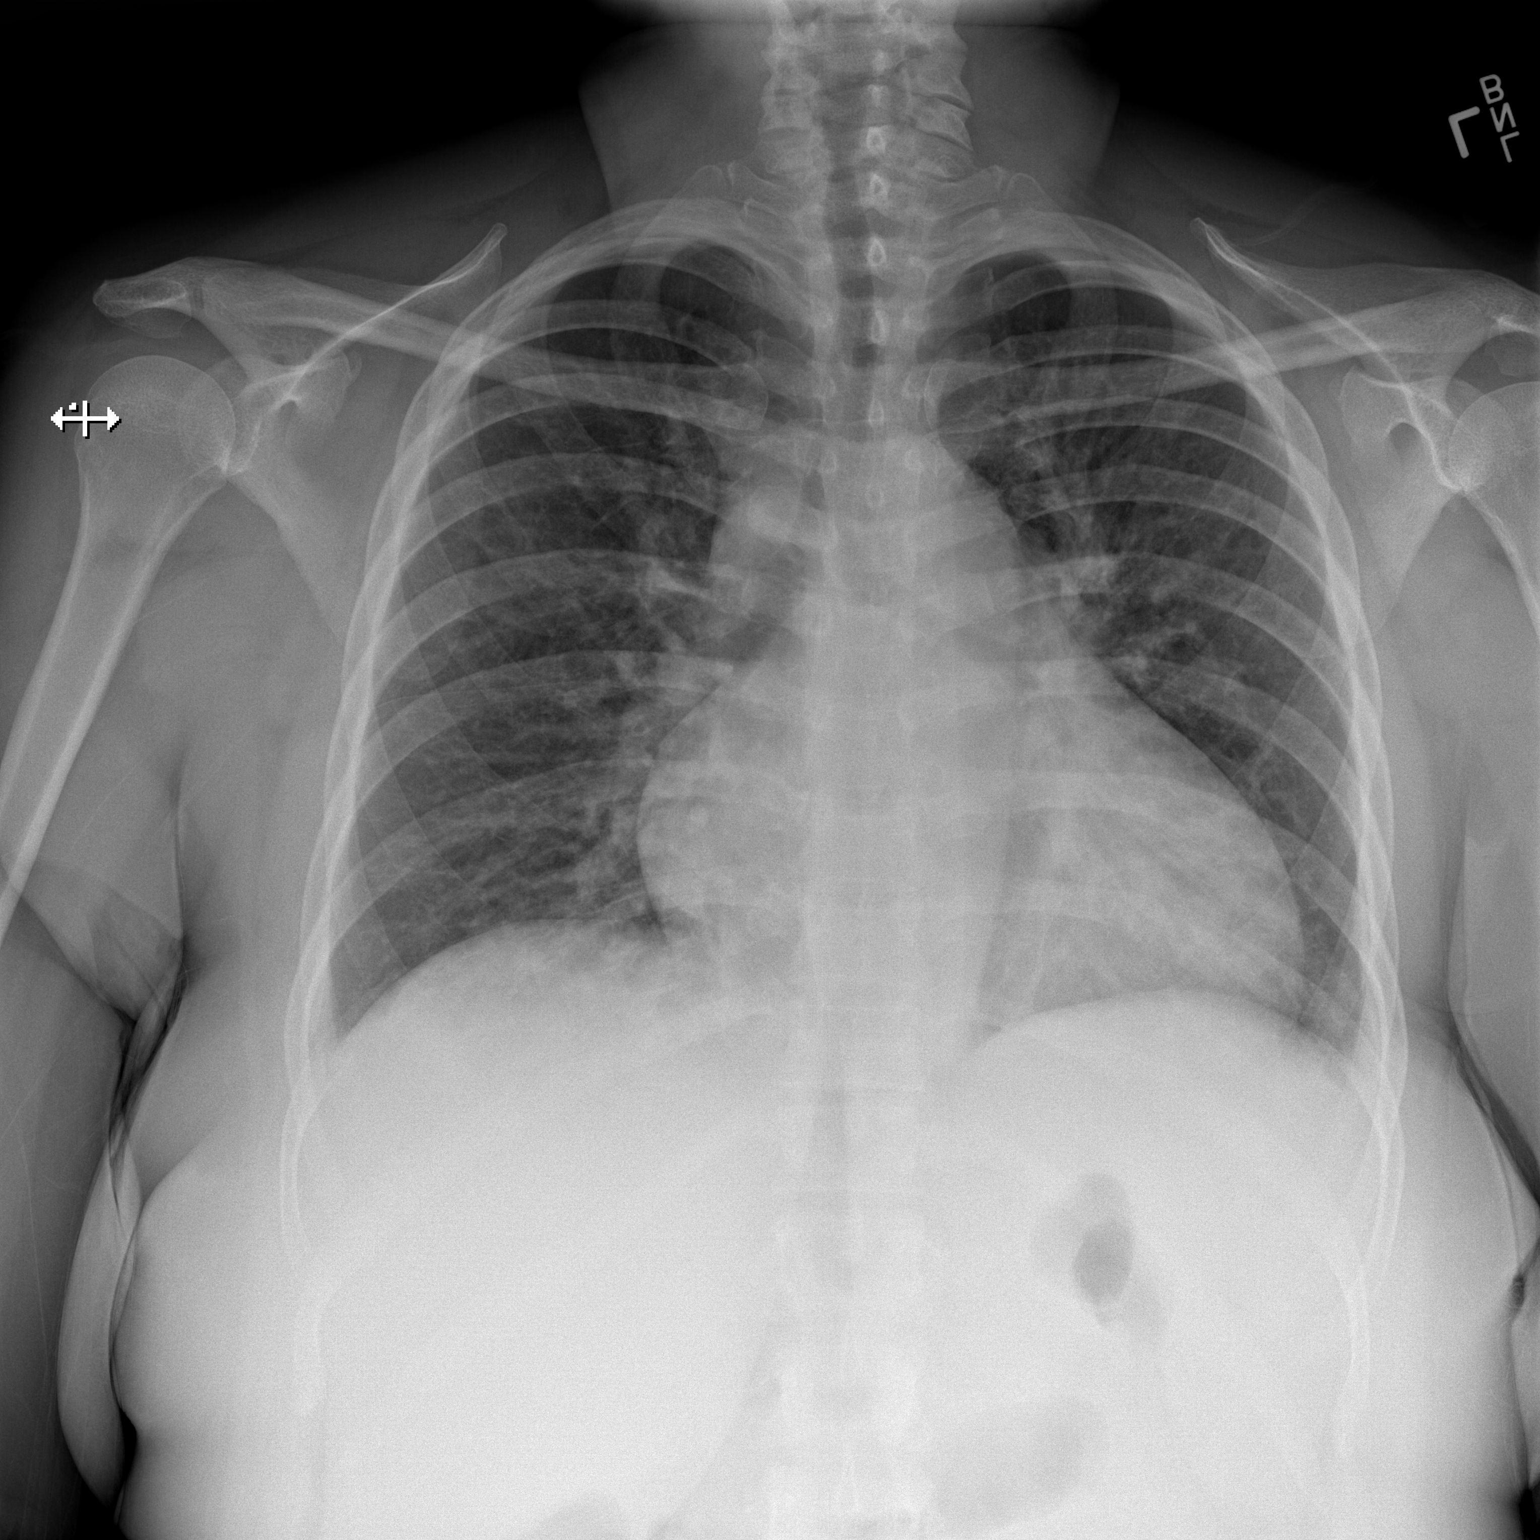
[im 2/2]
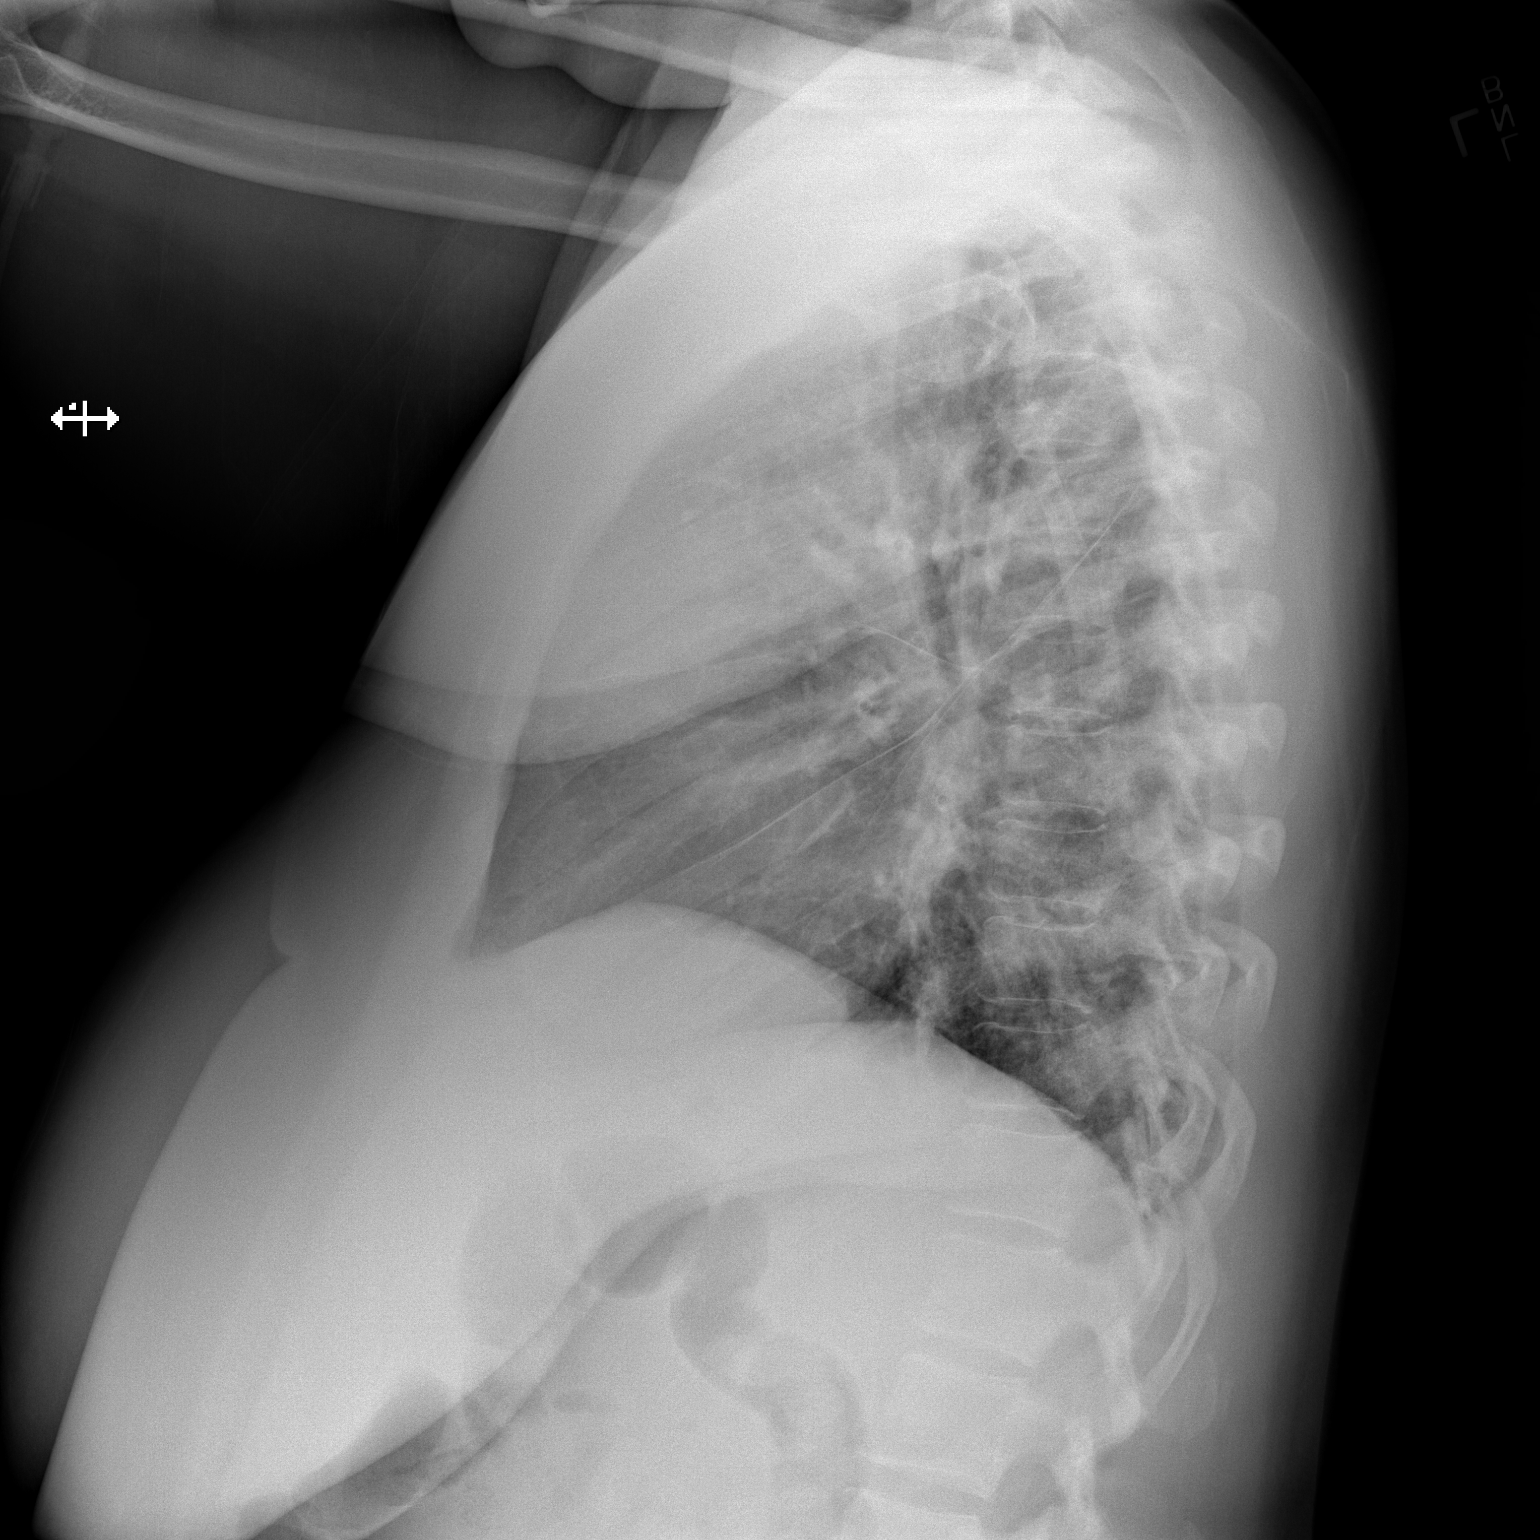

[2 of 2 positions shown; findings below may reference images not displayed]

FINDINGS: Mild cardiac enlargement is again noted. Increased pulmonary
vascular congestion is noted. Minimal bibasilar atelectasis is
present. No other significant airspace disease is present. The lung
volumes are low. The visualized soft tissues and bony thorax are
unremarkable.
IMPRESSION: 1. Low lung volumes exaggerate the heart size and interstitium.
2. Stable mild cardiomegaly with increasing pulmonary vascular
congestion.

## 2015-07-26 ENCOUNTER — Ambulatory Visit
Admission: RE | Admit: 2015-07-26 | Discharge: 2015-07-26 | Disposition: A | Discharge status: Home / Self Care | Payer: Self-pay | Source: Ambulatory Visit | Attending: Internal Medicine | Admitting: Internal Medicine

## 2015-07-26 DIAGNOSIS — D509 Iron deficiency anemia, unspecified: Secondary | ICD-10-CM

## 2015-07-28 ENCOUNTER — Inpatient Hospital Stay: Payer: Self-pay

## 2015-07-28 ENCOUNTER — Encounter: Payer: Self-pay | Admitting: Internal Medicine

## 2015-07-28 ENCOUNTER — Inpatient Hospital Stay: Payer: Self-pay | Attending: Internal Medicine | Admitting: Internal Medicine

## 2015-07-28 VITALS — BP 152/95 | HR 73 | Temp 97.2°F | Resp 18 | Ht 62.0 in | Wt 291.4 lb

## 2015-07-28 DIAGNOSIS — D75839 Thrombocytosis, unspecified: Secondary | ICD-10-CM

## 2015-07-28 DIAGNOSIS — D472 Monoclonal gammopathy: Secondary | ICD-10-CM | POA: Insufficient documentation

## 2015-07-28 DIAGNOSIS — D473 Essential (hemorrhagic) thrombocythemia: Secondary | ICD-10-CM | POA: Insufficient documentation

## 2015-07-28 DIAGNOSIS — Z79899 Other long term (current) drug therapy: Secondary | ICD-10-CM | POA: Insufficient documentation

## 2015-07-28 DIAGNOSIS — D509 Iron deficiency anemia, unspecified: Secondary | ICD-10-CM

## 2015-07-28 LAB — CBC WITH DIFFERENTIAL/PLATELET
BASOS PCT: 1 %
Basophils Absolute: 0.1 10*3/uL (ref 0–0.1)
EOS ABS: 0.3 10*3/uL (ref 0–0.7)
EOS PCT: 4 %
HCT: 33.3 % — ABNORMAL LOW (ref 35.0–47.0)
Hemoglobin: 11.1 g/dL — ABNORMAL LOW (ref 12.0–16.0)
Lymphocytes Relative: 27 %
Lymphs Abs: 2.2 10*3/uL (ref 1.0–3.6)
MCH: 29.6 pg (ref 26.0–34.0)
MCHC: 33.3 g/dL (ref 32.0–36.0)
MCV: 88.7 fL (ref 80.0–100.0)
MONO ABS: 0.4 10*3/uL (ref 0.2–0.9)
MONOS PCT: 5 %
Neutro Abs: 5 10*3/uL (ref 1.4–6.5)
Neutrophils Relative %: 63 %
PLATELETS: 479 10*3/uL — AB (ref 150–440)
RBC: 3.75 MIL/uL — ABNORMAL LOW (ref 3.80–5.20)
RDW: 20.7 % — AB (ref 11.5–14.5)
WBC: 8.1 10*3/uL (ref 3.6–11.0)

## 2015-07-28 LAB — FERRITIN: Ferritin: 29 ng/mL (ref 11–307)

## 2015-07-28 NOTE — Progress Notes (Signed)
Pt here to get results of labs.  She had appt with obgyn and she cancelled because she was on menstrual cycle. She will call back to r/s.  Fatigue is better.

## 2015-07-28 NOTE — Progress Notes (Signed)
Barbara Juarez  [phone]) [phone]  Fax:(336) [place] DOB: 05-01-1971  MR#: [id number]  [id number]  Date of service 02/22/2015.  Patient Care Team: Shepard General, MD as PCP - General (General Practice)  CHIEF COMPLAINT:  Chief Complaint  Patient presents with  . IDA    Patient with significant past medical history of iron deficiency anemia, mild cardiomegaly, pulmonary congestion, and a monoclonal gammopathy.  INTERVAL HISTORY: Barbara Juarez  returns to our clinic for a follow-up visit. She continues to have very heavy menstrual bleedings. She has not seen in GYN specialist yet due to her menstrual bleeding on the date of the scheduled appointment. She noticed improvement in her functional status with significantly less fatigue after Feraheme infusions. She continues to tolerate oral iron supplementation without any significant side effects.   REVIEW OF SYSTEMS:   Review of Systems  Constitutional: Negative for fever, chills, weight loss, malaise/fatigue and diaphoresis.  HENT: Negative for congestion, ear discharge, ear pain, hearing loss, nosebleeds, sore throat and tinnitus.   Eyes: Negative for blurred vision, double vision, photophobia, pain, discharge and redness.  Respiratory: Negative for cough, hemoptysis, sputum production, shortness of breath, wheezing and stridor.   Cardiovascular: Negative for chest pain, palpitations, orthopnea, claudication, leg swelling and PND.  Gastrointestinal: Negative for heartburn, nausea, vomiting, abdominal pain, diarrhea, constipation, blood in stool and melena.  Genitourinary: Negative.   Musculoskeletal: Negative.   Skin: Negative.   Neurological: Negative for dizziness, tingling, focal weakness, seizures, weakness and headaches.  Endo/Heme/Allergies: Does not bruise/bleed easily.  Psychiatric/Behavioral: Negative for depression. The patient is not nervous/anxious and does not have insomnia.       As per HPI. Otherwise, a complete review of systems is negatve.  ONCOLOGY HISTORY:  No history exists.    PAST MEDICAL HISTORY: No past medical history on file.  PAST SURGICAL HISTORY: No past surgical history on file.  FAMILY HISTORY Family History  Problem Relation Age of Onset  . Diabetes Mother   . Breast cancer Neg Hx     GYNECOLOGIC HISTORY:  Patient's last menstrual period was 07/10/2015.       HEALTH MAINTENANCE: Social History  Substance Use Topics  . Smoking status: Never Smoker   . Smokeless tobacco: Not on file  . Alcohol Use: Not on file     Colonoscopy:  PAP:  Bone density:  Lipid panel:  No Known Allergies  Current Outpatient Prescriptions  Medication Sig Dispense Refill  . ferrous sulfate 325 (65 FE) MG EC tablet Take 325 mg by mouth 3 (three) times daily with meals.     No current facility-administered medications for this visit.    OBJECTIVE: BP 152/95 mmHg  Pulse 73  Temp(Src) 97.2 F (36.2 C) (Tympanic)  Resp 18  Ht 5\' 2"  (1.575 m)  Wt 291 lb 7.2 oz (132.2 kg)  BMI 53.29 kg/m2  LMP 07/10/2015   Body mass index is 53.29 kg/(m^2).    ECOG FS:0 - Asymptomatic  General: Morbidly obese African-American female no acute distress. Eyes: Pink conjunctiva, anicteric sclera. HEENT: Normocephalic, moist mucous membranes, clear oropharnyx. Lungs: Clear to auscultation bilaterally. Heart: Regular rate and rhythm. No rubs, murmurs, or gallops. Musculoskeletal: No edema, cyanosis, or clubbing. Neuro: Alert, answering all questions appropriately. Cranial nerves grossly intact. Skin: No rashes or petechiae noted. Psych: Normal affect.    LAB RESULTS:  Appointment on 07/28/2015  Component Date Value Ref Range Status  . WBC 07/28/2015 8.1  3.6 - 11.0  K/uL Final  . RBC 07/28/2015 3.75* 3.80 - 5.20 MIL/uL Final  . Hemoglobin 07/28/2015 11.1* 12.0 - 16.0 g/dL Final  . HCT 07/28/2015 33.3* 35.0 - 47.0 % Final  . MCV 07/28/2015 88.7  80.0  - 100.0 fL Final  . MCH 07/28/2015 29.6  26.0 - 34.0 pg Final  . MCHC 07/28/2015 33.3  32.0 - 36.0 g/dL Final  . RDW 07/28/2015 20.7* 11.5 - 14.5 % Final  . Platelets 07/28/2015 479* 150 - 440 K/uL Final  . Neutrophils Relative % 07/28/2015 63   Final  . Neutro Abs 07/28/2015 5.0  1.4 - 6.5 K/uL Final  . Lymphocytes Relative 07/28/2015 27   Final  . Lymphs Abs 07/28/2015 2.2  1.0 - 3.6 K/uL Final  . Monocytes Relative 07/28/2015 5   Final  . Monocytes Absolute 07/28/2015 0.4  0.2 - 0.9 K/uL Final  . Eosinophils Relative 07/28/2015 4   Final  . Eosinophils Absolute 07/28/2015 0.3  0 - 0.7 K/uL Final  . Basophils Relative 07/28/2015 1   Final  . Basophils Absolute 07/28/2015 0.1  0 - 0.1 K/uL Final  . Ferritin 07/28/2015 29  11 - 307 ng/mL Final    STUDIES: Mm Digital Screening  07/26/2015  CLINICAL DATA:  Screening. EXAM: DIGITAL SCREENING BILATERAL MAMMOGRAM WITH CAD COMPARISON:  None. ACR Breast Density Category b: There are scattered areas of fibroglandular density. FINDINGS: There are no findings suspicious for malignancy. Images were processed with CAD. IMPRESSION: No mammographic evidence of malignancy. A result letter of this screening mammogram will be mailed directly to the patient. RECOMMENDATION: Screening mammogram in one year. (Code:SM-B-01Y) BI-RADS CATEGORY  1: Negative. Electronically Signed   By: Lillia Mountain M.D.   On: 07/26/2015 09:57    ASSESSMENT:  Iron deficiency anemia. Monoclonal gammopathy. Thrombocytosis  PLAN:  1. IDA.- Secondary to heavy menstrual bleeding. She had significant improvement in hemoglobin, MCV, ferritin and energy level after 2 infusions of Feraheme. She'll continue with oral supplementation for now, and we will recheck her CBC and ferritin levels one week before her next appointment in March 2017. At that point we will decide on whether she needs any additional iron infusions. She will have to reschedule her visit with the GYN.  2. Monoclonal  gammopathy. Most recent SPEP was evaluated in March 2016. Patient was noted to have an M spike of 800 milligrams. Previously noted M spike of 900 mg. Overall readings are stable, We'll continue to monitor on an annual basis. We will repeat SPEP in March 2017.  3. Mammogram-was performed 2 days ago, appears normal. We will continue with annual mammograms.  4. Thrombocytosis- despite correcting iron deficiency she continues to have thrombocytosis. During her next appointment we will check her for [id number], calreticulin and MPL mutations.  Patient expressed understanding and was in agreement with this plan. She also understands that She can call clinic at any time with any questions, concerns, or complaints.     Roxana Hires, MD   07/28/2015 9:09 AM

## 2015-08-23 ENCOUNTER — Other Ambulatory Visit: Payer: Self-pay

## 2015-08-23 ENCOUNTER — Ambulatory Visit: Payer: Self-pay

## 2015-09-25 ENCOUNTER — Inpatient Hospital Stay: Payer: Self-pay | Attending: Family Medicine

## 2015-09-25 DIAGNOSIS — D472 Monoclonal gammopathy: Secondary | ICD-10-CM | POA: Insufficient documentation

## 2015-09-25 DIAGNOSIS — Z79899 Other long term (current) drug therapy: Secondary | ICD-10-CM | POA: Insufficient documentation

## 2015-09-25 DIAGNOSIS — D509 Iron deficiency anemia, unspecified: Secondary | ICD-10-CM | POA: Insufficient documentation

## 2015-09-25 DIAGNOSIS — D473 Essential (hemorrhagic) thrombocythemia: Secondary | ICD-10-CM | POA: Insufficient documentation

## 2015-09-25 DIAGNOSIS — D75839 Thrombocytosis, unspecified: Secondary | ICD-10-CM

## 2015-09-25 LAB — CBC WITH DIFFERENTIAL/PLATELET
BASOS ABS: 0.1 10*3/uL (ref 0–0.1)
BASOS PCT: 1 %
EOS ABS: 0.3 10*3/uL (ref 0–0.7)
Eosinophils Relative: 3 %
HEMATOCRIT: 32.3 % — AB (ref 35.0–47.0)
Hemoglobin: 10.8 g/dL — ABNORMAL LOW (ref 12.0–16.0)
Lymphocytes Relative: 28 %
Lymphs Abs: 2.3 10*3/uL (ref 1.0–3.6)
MCH: 29.7 pg (ref 26.0–34.0)
MCHC: 33.5 g/dL (ref 32.0–36.0)
MCV: 88.6 fL (ref 80.0–100.0)
Monocytes Absolute: 0.5 10*3/uL (ref 0.2–0.9)
Monocytes Relative: 5 %
NEUTROS ABS: 5.3 10*3/uL (ref 1.4–6.5)
NEUTROS PCT: 63 %
Platelets: 422 10*3/uL (ref 150–440)
RBC: 3.65 MIL/uL — AB (ref 3.80–5.20)
RDW: 17.7 % — ABNORMAL HIGH (ref 11.5–14.5)
WBC: 8.4 10*3/uL (ref 3.6–11.0)

## 2015-09-25 LAB — FERRITIN: Ferritin: 17 ng/mL (ref 11–307)

## 2015-09-26 LAB — PROTEIN ELECTROPHORESIS, SERUM
A/G RATIO SPE: 0.8 (ref 0.7–1.7)
ALBUMIN ELP: 3.1 g/dL (ref 2.9–4.4)
ALPHA-1-GLOBULIN: 0.2 g/dL (ref 0.0–0.4)
ALPHA-2-GLOBULIN: 0.7 g/dL (ref 0.4–1.0)
BETA GLOBULIN: 1.2 g/dL (ref 0.7–1.3)
GLOBULIN, TOTAL: 3.8 g/dL (ref 2.2–3.9)
Gamma Globulin: 1.6 g/dL (ref 0.4–1.8)
M-Spike, %: 0.7 g/dL — ABNORMAL HIGH
Total Protein ELP: 6.9 g/dL (ref 6.0–8.5)

## 2015-10-02 ENCOUNTER — Inpatient Hospital Stay: Payer: Self-pay | Attending: Family Medicine | Admitting: Family Medicine

## 2015-10-02 ENCOUNTER — Encounter: Payer: Self-pay | Admitting: Family Medicine

## 2015-10-02 VITALS — BP 148/78 | HR 70 | Temp 97.2°F | Resp 18 | Ht 62.0 in | Wt 294.8 lb

## 2015-10-02 DIAGNOSIS — D472 Monoclonal gammopathy: Secondary | ICD-10-CM | POA: Insufficient documentation

## 2015-10-02 DIAGNOSIS — N920 Excessive and frequent menstruation with regular cycle: Secondary | ICD-10-CM | POA: Insufficient documentation

## 2015-10-02 DIAGNOSIS — D509 Iron deficiency anemia, unspecified: Secondary | ICD-10-CM | POA: Insufficient documentation

## 2015-10-02 DIAGNOSIS — R0989 Other specified symptoms and signs involving the circulatory and respiratory systems: Secondary | ICD-10-CM | POA: Insufficient documentation

## 2015-10-02 DIAGNOSIS — I517 Cardiomegaly: Secondary | ICD-10-CM | POA: Insufficient documentation

## 2015-10-02 DIAGNOSIS — D473 Essential (hemorrhagic) thrombocythemia: Secondary | ICD-10-CM | POA: Insufficient documentation

## 2015-10-02 DIAGNOSIS — Z79899 Other long term (current) drug therapy: Secondary | ICD-10-CM | POA: Insufficient documentation

## 2015-10-02 NOTE — Progress Notes (Signed)
[place]  Telephone:(336) [phone]  Fax:(336) [place] DOB: 1970-12-31  MR#: [id number]  [id number]  Date of service 02/22/2015.  Patient Care Team: Shepard General, MD as PCP - General (General Practice)  CHIEF COMPLAINT:  Chief Complaint  Patient presents with  . IDA    Patient with significant past medical history of iron deficiency anemia, mild cardiomegaly, pulmonary congestion, and a monoclonal gammopathy.  INTERVAL HISTORY: Barbara Juarez  returns to our clinic for further evaluation regarding IDA. She continues to have heavy menstrual bleeding, LMP 09/07/15 lasting 8 days but not as heavy as previous cycles. She has not seen GYN specialist, she sees [facility] for GYN care. She continues to tolerate oral iron supplementation without any significant side effects. Overall, reports feeling very well and denies any sob, fatigue, dizziness.   REVIEW OF SYSTEMS:   Review of Systems  Constitutional: Negative for fever, chills, weight loss, malaise/fatigue and diaphoresis.  HENT: Negative for congestion, ear discharge, ear pain, hearing loss, nosebleeds, sore throat and tinnitus.   Eyes: Negative for blurred vision, double vision, photophobia, pain, discharge and redness.  Respiratory: Negative for cough, hemoptysis, sputum production, shortness of breath, wheezing and stridor.   Cardiovascular: Negative for chest pain, palpitations, orthopnea, claudication, leg swelling and PND.  Gastrointestinal: Negative for heartburn, nausea, vomiting, abdominal pain, diarrhea, constipation, blood in stool and melena.  Genitourinary: Negative.   Musculoskeletal: Negative.   Skin: Negative.   Neurological: Negative for dizziness, tingling, focal weakness, seizures, weakness and headaches.  Endo/Heme/Allergies: Does not bruise/bleed easily.  Psychiatric/Behavioral: Negative for depression. The patient is not nervous/anxious and does not have insomnia.      As per HPI. Otherwise, a complete review of systems is negatve.  PAST MEDICAL HISTORY: Past Medical History  Diagnosis Date  . Anemia   . Hypertension     PAST SURGICAL HISTORY: History reviewed. No pertinent past surgical history.  FAMILY HISTORY Family History  Problem Relation Age of Onset  . Diabetes Mother   . Breast cancer Neg Hx     GYNECOLOGIC HISTORY:  09/07/2015    HEALTH MAINTENANCE: Social History  Substance Use Topics  . Smoking status: Never Smoker   . Smokeless tobacco: None  . Alcohol Use: 0.6 oz/week    1 Glasses of wine per week     Comment: once a month    No Known Allergies  Current Outpatient Prescriptions  Medication Sig Dispense Refill  . ferrous sulfate 325 (65 FE) MG EC tablet Take 325 mg by mouth 3 (three) times daily with meals.     No current facility-administered medications for this visit.    OBJECTIVE: BP 148/78 mmHg  Pulse 70  Temp(Src) 97.2 F (36.2 C) (Tympanic)  Resp 18  Ht 5\' 2"  (1.575 m)  Wt 294 lb 12.1 oz (133.7 kg)  BMI 53.90 kg/m2   Body mass index is 53.9 kg/(m^2).    ECOG FS:0 - Asymptomatic  General: Well nourished, no acute distress. Eyes: Pink conjunctiva, anicteric sclera. HEENT: Normocephalic, moist mucous membranes, clear oropharnyx. Lungs: Clear to auscultation bilaterally. Heart: Regular rate and rhythm. No rubs, murmurs, or gallops. Musculoskeletal: No edema, cyanosis, or clubbing. Neuro: Alert, answering all questions appropriately. Cranial nerves grossly intact. Skin: No rashes or petechiae noted. Psych: Normal affect.    LAB RESULTS:  No visits with results within 3 Day(s) from this visit. Latest known visit with results is:  Appointment on 09/25/2015  Component Date Value Ref Range Status  .  WBC 09/25/2015 8.4  3.6 - 11.0 K/uL Final  . RBC 09/25/2015 3.65* 3.80 - 5.20 MIL/uL Final  . Hemoglobin 09/25/2015 10.8* 12.0 - 16.0 g/dL Final  . HCT 09/25/2015 32.3* 35.0 - 47.0 % Final  . MCV  09/25/2015 88.6  80.0 - 100.0 fL Final  . MCH 09/25/2015 29.7  26.0 - 34.0 pg Final  . MCHC 09/25/2015 33.5  32.0 - 36.0 g/dL Final  . RDW 09/25/2015 17.7* 11.5 - 14.5 % Final  . Platelets 09/25/2015 422  150 - 440 K/uL Final  . Neutrophils Relative % 09/25/2015 63   Final  . Neutro Abs 09/25/2015 5.3  1.4 - 6.5 K/uL Final  . Lymphocytes Relative 09/25/2015 28   Final  . Lymphs Abs 09/25/2015 2.3  1.0 - 3.6 K/uL Final  . Monocytes Relative 09/25/2015 5   Final  . Monocytes Absolute 09/25/2015 0.5  0.2 - 0.9 K/uL Final  . Eosinophils Relative 09/25/2015 3   Final  . Eosinophils Absolute 09/25/2015 0.3  0 - 0.7 K/uL Final  . Basophils Relative 09/25/2015 1   Final  . Basophils Absolute 09/25/2015 0.1  0 - 0.1 K/uL Final  . Total Protein ELP 09/25/2015 6.9  6.0 - 8.5 g/dL Final  . Albumin ELP 09/25/2015 3.1  2.9 - 4.4 g/dL Final  . Alpha-1-Globulin 09/25/2015 0.2  0.0 - 0.4 g/dL Final  . Alpha-2-Globulin 09/25/2015 0.7  0.4 - 1.0 g/dL Final  . Beta Globulin 09/25/2015 1.2  0.7 - 1.3 g/dL Final  . Gamma Globulin 09/25/2015 1.6  0.4 - 1.8 g/dL Final  . M-Spike, % 09/25/2015 0.7* Not Observed g/dL Final  . SPE Interp. 09/25/2015 Comment   Final   Comment: (NOTE) The SPE pattern demonstrates a single peak (M-spike) in the gamma region which may represent monoclonal protein. This peak may also be caused by circulating immune complexes, cryoglobulins, C-reactive protein, fibrinogen or hemolysis.  If clinically indicated, the presence of a monoclonal gammopathy may be confirmed by immuno- fixation, as well as an evaluation of the urine for the presence of Bence-Jones protein. Performed At: [facility] [place], [place] [id number] Lindon Romp MD [id number]   . Comment 09/25/2015 Comment   Final   Comment: (NOTE) Protein electrophoresis scan will follow via computer, mail, or courier delivery.   Marland Kitchen GLOBULIN, TOTAL 09/25/2015 3.8  2.2 - 3.9 g/dL Corrected  .  A/G Ratio 09/25/2015 0.8  0.7 - 1.7 Corrected  . Ferritin 09/25/2015 17  11 - 307 ng/mL Final    STUDIES: No results found.  ASSESSMENT:  Iron deficiency anemia. Monoclonal gammopathy. Thrombocytosis.  PLAN:  1. IDA. Secondary to heavy menstrual bleeding. She had significant improvement in hemoglobin, MCV, ferritin and energy level after 2 infusions of Feraheme. She'll continue with oral supplementation for now, and we will recheck her CBC and ferritin levels one week before her next appointment in 3 months. At that point we will decide on whether she needs any additional iron infusions.  2. Monoclonal gammopathy. SPEP was evaluated on 09/22/2015 and M spike was 700mg . Patient was noted to have previous M spikes of 800 milligrams and 900 mg. Overall readings are stable, We'll continue to monitor on an annual basis. We will repeat SPEP in March 2018. 3. Thrombocytosis. Platelet level 422. [id number], calreticulin and MPL mutations are still pending. Continue to monitor.  Dr. Grayland Ormond was available for consultation and review of plan of care for this patient.  Patient expressed understanding and was in  agreement with this plan. She also understands that She can call clinic at any time with any questions, concerns, or complaints.    Evlyn Kanner, NP   10/02/2015 11:17 AM

## 2015-10-02 NOTE — Progress Notes (Signed)
Pt feels good today, she is tolerating iron pills tid without any side effects.  Last menstrual cycle 3/9 and lasted 8 days, not as much bleeding this time.  She says she feels so much better than 4 months ago.

## 2015-10-09 LAB — JAK2 V617F, W REFLEX TO CALR/E12/MPL

## 2015-10-09 LAB — CALR + JAK2 E12-15 + MPL (REFLEXED)

## 2015-10-09 LAB — KAPPA/LAMBDA LIGHT CHAINS
KAPPA, LAMDA LIGHT CHAIN RATIO: 1.68 — AB (ref 0.26–1.65)
Kappa free light chain: 41.12 mg/L — ABNORMAL HIGH (ref 3.30–19.40)
Lambda free light chains: 24.43 mg/L (ref 5.71–26.30)

## 2016-01-08 ENCOUNTER — Inpatient Hospital Stay: Payer: Self-pay | Attending: Oncology

## 2016-01-08 DIAGNOSIS — D472 Monoclonal gammopathy: Secondary | ICD-10-CM | POA: Insufficient documentation

## 2016-01-08 DIAGNOSIS — I10 Essential (primary) hypertension: Secondary | ICD-10-CM | POA: Insufficient documentation

## 2016-01-08 DIAGNOSIS — Z79899 Other long term (current) drug therapy: Secondary | ICD-10-CM | POA: Insufficient documentation

## 2016-01-08 DIAGNOSIS — D509 Iron deficiency anemia, unspecified: Secondary | ICD-10-CM | POA: Insufficient documentation

## 2016-01-08 LAB — CBC WITH DIFFERENTIAL/PLATELET
BASOS ABS: 0.1 10*3/uL (ref 0–0.1)
Basophils Relative: 1 %
EOS ABS: 0.3 10*3/uL (ref 0–0.7)
EOS PCT: 3 %
HCT: 31.5 % — ABNORMAL LOW (ref 35.0–47.0)
Hemoglobin: 10.5 g/dL — ABNORMAL LOW (ref 12.0–16.0)
LYMPHS ABS: 2.8 10*3/uL (ref 1.0–3.6)
LYMPHS PCT: 27 %
MCH: 28.6 pg (ref 26.0–34.0)
MCHC: 33.4 g/dL (ref 32.0–36.0)
MCV: 85.7 fL (ref 80.0–100.0)
MONO ABS: 0.6 10*3/uL (ref 0.2–0.9)
Monocytes Relative: 6 %
Neutro Abs: 6.8 10*3/uL — ABNORMAL HIGH (ref 1.4–6.5)
Neutrophils Relative %: 63 %
PLATELETS: 435 10*3/uL (ref 150–440)
RBC: 3.68 MIL/uL — ABNORMAL LOW (ref 3.80–5.20)
RDW: 16 % — AB (ref 11.5–14.5)
WBC: 10.6 10*3/uL (ref 3.6–11.0)

## 2016-01-08 LAB — FERRITIN: Ferritin: 13 ng/mL (ref 11–307)

## 2016-01-08 LAB — IRON AND TIBC
Iron: 114 ug/dL (ref 28–170)
Saturation Ratios: 36 % — ABNORMAL HIGH (ref 10.4–31.8)
TIBC: 320 ug/dL (ref 250–450)
UIBC: 206 ug/dL

## 2016-01-15 ENCOUNTER — Ambulatory Visit: Payer: Self-pay | Admitting: Oncology

## 2016-01-16 ENCOUNTER — Inpatient Hospital Stay (HOSPITAL_BASED_OUTPATIENT_CLINIC_OR_DEPARTMENT_OTHER): Payer: Self-pay | Admitting: Oncology

## 2016-01-16 ENCOUNTER — Inpatient Hospital Stay: Payer: Self-pay

## 2016-01-16 VITALS — BP 135/78 | HR 72

## 2016-01-16 VITALS — BP 159/88 | HR 94 | Temp 96.8°F | Resp 18 | Wt 304.1 lb

## 2016-01-16 DIAGNOSIS — D472 Monoclonal gammopathy: Secondary | ICD-10-CM

## 2016-01-16 DIAGNOSIS — D509 Iron deficiency anemia, unspecified: Secondary | ICD-10-CM

## 2016-01-16 DIAGNOSIS — Z79899 Other long term (current) drug therapy: Secondary | ICD-10-CM

## 2016-01-16 MED ORDER — SODIUM CHLORIDE 0.9 % IV SOLN
510.0000 mg | Freq: Once | INTRAVENOUS | Status: AC
  Administered 2016-01-16: 510 mg via INTRAVENOUS
  Filled 2016-01-16: qty 17

## 2016-01-16 MED ORDER — SODIUM CHLORIDE 0.9 % IV SOLN
Freq: Once | INTRAVENOUS | Status: DC
Start: 2016-01-16 — End: 2016-01-16
  Filled 2016-01-16: qty 1000

## 2016-01-16 NOTE — Progress Notes (Signed)
Offers no complaints. Feeling well. 

## 2016-01-23 NOTE — Progress Notes (Signed)
[place]  Telephone:(336) [phone]  Fax:(336) [place] DOB: 1970/07/21  MR#: [id number]  [id number]  Date of service 02/22/2015.  Patient Care Team: Shepard General, MD as PCP - General (General Practice)  CHIEF COMPLAINT:  Iron deficiency anemia, MGUS.   INTERVAL HISTORY:  Patient returns to clinic today for repeat laboratory work, further evaluation, and consideration of additional IV iron. She currently feels well and is asymptomatic. She does not complain of weakness or fatigue today. She continues to have heavy menstrual bleeding that is unchanged. She has no neurologic complaints. She denies any recent fevers or illnesses. She denies any chest pain or shortness of breath. She denies any nausea, vomiting, constipation, or diarrhea. She has no melena or hematochezia. Patient feels at her baseline and offers no specific complaints today.   REVIEW OF SYSTEMS:   Review of Systems  Constitutional: Negative for fever, chills, weight loss, malaise/fatigue and diaphoresis.  HENT: Negative for congestion, ear discharge, ear pain, hearing loss, nosebleeds, sore throat and tinnitus.   Eyes: Negative for blurred vision, double vision, photophobia, pain, discharge and redness.  Respiratory: Negative for cough, hemoptysis, sputum production, shortness of breath, wheezing and stridor.   Cardiovascular: Negative for chest pain, palpitations, orthopnea, claudication, leg swelling and PND.  Gastrointestinal: Negative for heartburn, nausea, vomiting, abdominal pain, diarrhea, constipation, blood in stool and melena.  Genitourinary: Negative.   Musculoskeletal: Negative.   Skin: Negative.   Neurological: Negative for dizziness, tingling, focal weakness, seizures, weakness and headaches.  Endo/Heme/Allergies: Does not bruise/bleed easily.  Psychiatric/Behavioral: Negative for depression. The patient is not nervous/anxious and does not have insomnia.     As  per HPI. Otherwise, a complete review of systems is negatve.  PAST MEDICAL HISTORY: Past Medical History:  Diagnosis Date  . Anemia   . Hypertension     PAST SURGICAL HISTORY: No past surgical history on file.  FAMILY HISTORY Family History  Problem Relation Age of Onset  . Diabetes Mother   . Breast cancer Neg Hx     GYNECOLOGIC HISTORY:  09/07/2015    HEALTH MAINTENANCE: Social History  Substance Use Topics  . Smoking status: Never Smoker  . Smokeless tobacco: Not on file  . Alcohol use 0.6 oz/week    1 Glasses of wine per week     Comment: once a month    No Known Allergies  Current Outpatient Prescriptions  Medication Sig Dispense Refill  . ferrous sulfate 325 (65 FE) MG EC tablet Take 325 mg by mouth 3 (three) times daily with meals.    . hydrochlorothiazide (HYDRODIURIL) 50 MG tablet Take 50 mg by mouth daily.     No current facility-administered medications for this visit.     OBJECTIVE: BP (!) 159/88 (BP Location: Right Arm, Patient Position: Sitting)   Pulse 94   Temp (!) 96.8 F (36 C) (Tympanic)   Resp 18   Wt (!) 304 lb 2 oz (137.9 kg)   BMI 55.63 kg/m    Body mass index is 55.63 kg/m.    ECOG FS:0 - Asymptomatic  General: Well nourished, no acute distress. Eyes: Pink conjunctiva, anicteric sclera. HEENT: Normocephalic, moist mucous membranes, clear oropharnyx. Lungs: Clear to auscultation bilaterally. Heart: Regular rate and rhythm. No rubs, murmurs, or gallops. Musculoskeletal: No edema, cyanosis, or clubbing. Neuro: Alert, answering all questions appropriately. Cranial nerves grossly intact. Skin: No rashes or petechiae noted. Psych: Normal affect.    LAB RESULTS:  No visits with  results within 3 Day(s) from this visit.  Latest known visit with results is:  Appointment on 01/08/2016  Component Date Value Ref Range Status  . WBC 01/08/2016 10.6  3.6 - 11.0 K/uL Final  . RBC 01/08/2016 3.68* 3.80 - 5.20 MIL/uL Final  . Hemoglobin  01/08/2016 10.5* 12.0 - 16.0 g/dL Final  . HCT 01/08/2016 31.5* 35.0 - 47.0 % Final  . MCV 01/08/2016 85.7  80.0 - 100.0 fL Final  . MCH 01/08/2016 28.6  26.0 - 34.0 pg Final  . MCHC 01/08/2016 33.4  32.0 - 36.0 g/dL Final  . RDW 01/08/2016 16.0* 11.5 - 14.5 % Final  . Platelets 01/08/2016 435  150 - 440 K/uL Final  . Neutrophils Relative % 01/08/2016 63  % Final  . Neutro Abs 01/08/2016 6.8* 1.4 - 6.5 K/uL Final  . Lymphocytes Relative 01/08/2016 27  % Final  . Lymphs Abs 01/08/2016 2.8  1.0 - 3.6 K/uL Final  . Monocytes Relative 01/08/2016 6  % Final  . Monocytes Absolute 01/08/2016 0.6  0.2 - 0.9 K/uL Final  . Eosinophils Relative 01/08/2016 3  % Final  . Eosinophils Absolute 01/08/2016 0.3  0 - 0.7 K/uL Final  . Basophils Relative 01/08/2016 1  % Final  . Basophils Absolute 01/08/2016 0.1  0 - 0.1 K/uL Final  . Iron 01/08/2016 114  28 - 170 ug/dL Final  . TIBC 01/08/2016 320  250 - 450 ug/dL Final  . Saturation Ratios 01/08/2016 36* 10.4 - 31.8 % Final  . UIBC 01/08/2016 206  ug/dL Final  . Ferritin 01/08/2016 13  11 - 307 ng/mL Final   Lab Results  Component Value Date   TOTALPROTELP 6.9 09/25/2015   ALBUMINELP 3.1 09/25/2015   A1GS 0.2 09/25/2015   A2GS 0.7 09/25/2015   BETS 1.2 09/25/2015   GAMS 1.6 09/25/2015   MSPIKE 0.7 (H) 09/25/2015   SPEI Comment 09/25/2015    STUDIES: No results found.  ASSESSMENT:  Iron deficiency anemia, MGUS.  PLAN:   1. Iron deficiency anemia: Likely secondary to patient's persistent heavy menstrual bleeding. Patient's hemoglobin has improved, but her iron stores are mildly decreased. Proceed with one infusion of IV Feraheme today. Return to clinic in 4 months with repeat laboratory work and further evaluation. She has been instructed to continue her oral iron supplementation. 2. MGUS: Patient's most recent M spike on September 25, 2015 was reported as 0.7. No intervention is needed at this time. Repeat with next laboratory draw. 3.  Thrombocytosis: Resolved.  Patient expressed understanding and was in agreement with this plan. She also understands that She can call clinic at any time with any questions, concerns, or complaints.    Lloyd Huger, MD   01/23/2016 10:26 PM

## 2016-05-16 ENCOUNTER — Other Ambulatory Visit: Payer: Self-pay | Admitting: *Deleted

## 2016-05-16 DIAGNOSIS — D509 Iron deficiency anemia, unspecified: Secondary | ICD-10-CM

## 2016-05-20 ENCOUNTER — Inpatient Hospital Stay: Payer: Self-pay | Attending: Oncology

## 2016-05-20 DIAGNOSIS — Z79899 Other long term (current) drug therapy: Secondary | ICD-10-CM | POA: Insufficient documentation

## 2016-05-20 DIAGNOSIS — N920 Excessive and frequent menstruation with regular cycle: Secondary | ICD-10-CM | POA: Insufficient documentation

## 2016-05-20 DIAGNOSIS — D472 Monoclonal gammopathy: Secondary | ICD-10-CM | POA: Insufficient documentation

## 2016-05-20 DIAGNOSIS — I10 Essential (primary) hypertension: Secondary | ICD-10-CM | POA: Insufficient documentation

## 2016-05-20 DIAGNOSIS — D509 Iron deficiency anemia, unspecified: Secondary | ICD-10-CM

## 2016-05-20 DIAGNOSIS — D500 Iron deficiency anemia secondary to blood loss (chronic): Secondary | ICD-10-CM | POA: Insufficient documentation

## 2016-05-20 LAB — CBC WITH DIFFERENTIAL/PLATELET
Basophils Absolute: 0.1 10*3/uL (ref 0–0.1)
Basophils Relative: 1 %
EOS ABS: 0.3 10*3/uL (ref 0–0.7)
EOS PCT: 3 %
HCT: 34.6 % — ABNORMAL LOW (ref 35.0–47.0)
Hemoglobin: 11.4 g/dL — ABNORMAL LOW (ref 12.0–16.0)
LYMPHS ABS: 2.8 10*3/uL (ref 1.0–3.6)
Lymphocytes Relative: 29 %
MCH: 27.4 pg (ref 26.0–34.0)
MCHC: 33.1 g/dL (ref 32.0–36.0)
MCV: 82.9 fL (ref 80.0–100.0)
MONO ABS: 0.5 10*3/uL (ref 0.2–0.9)
MONOS PCT: 5 %
Neutro Abs: 6 10*3/uL (ref 1.4–6.5)
Neutrophils Relative %: 62 %
PLATELETS: 425 10*3/uL (ref 150–440)
RBC: 4.17 MIL/uL (ref 3.80–5.20)
RDW: 15.9 % — AB (ref 11.5–14.5)
WBC: 9.7 10*3/uL (ref 3.6–11.0)

## 2016-05-20 LAB — IRON AND TIBC
Iron: 48 ug/dL (ref 28–170)
Saturation Ratios: 15 % (ref 10.4–31.8)
TIBC: 332 ug/dL (ref 250–450)
UIBC: 284 ug/dL

## 2016-05-20 LAB — FERRITIN: FERRITIN: 20 ng/mL (ref 11–307)

## 2016-05-22 ENCOUNTER — Ambulatory Visit: Payer: Self-pay | Admitting: Oncology

## 2016-05-22 ENCOUNTER — Ambulatory Visit: Payer: Self-pay

## 2016-05-27 ENCOUNTER — Encounter: Payer: Self-pay | Admitting: Oncology

## 2016-05-27 ENCOUNTER — Inpatient Hospital Stay: Payer: Self-pay

## 2016-05-27 ENCOUNTER — Inpatient Hospital Stay (HOSPITAL_BASED_OUTPATIENT_CLINIC_OR_DEPARTMENT_OTHER): Payer: Self-pay | Admitting: Oncology

## 2016-05-27 VITALS — BP 136/90 | HR 82 | Temp 98.1°F | Resp 18 | Wt 307.3 lb

## 2016-05-27 DIAGNOSIS — D500 Iron deficiency anemia secondary to blood loss (chronic): Secondary | ICD-10-CM

## 2016-05-27 DIAGNOSIS — D472 Monoclonal gammopathy: Secondary | ICD-10-CM

## 2016-05-27 DIAGNOSIS — Z79899 Other long term (current) drug therapy: Secondary | ICD-10-CM

## 2016-05-27 DIAGNOSIS — N920 Excessive and frequent menstruation with regular cycle: Secondary | ICD-10-CM

## 2016-05-27 NOTE — Progress Notes (Signed)
[place]  Telephone:(336) [phone]  Fax:(336) [place] DOB: 08-Nov-1970  MR#: [id number]  [id number]  Date of service 02/22/2015.  Patient Care Team: Shepard General, MD as PCP - General (General Practice)  CHIEF COMPLAINT:  Iron deficiency anemia, MGUS.   INTERVAL HISTORY:  Patient returns to clinic today for repeat laboratory work, further evaluation, and consideration of additional IV iron. She currently feels well and is asymptomatic. She does not complain of weakness or fatigue today. She has no neurologic complaints. She denies any recent fevers or illnesses. She denies any chest pain or shortness of breath. She denies any nausea, vomiting, constipation, or diarrhea. She has no melena or hematochezia. Patient feels at her baseline and offers no specific complaints today.   REVIEW OF SYSTEMS:   Review of Systems  Constitutional: Negative for fever, chills, weight loss, malaise/fatigue and diaphoresis.  HENT: Negative for congestion, ear discharge, ear pain, hearing loss, nosebleeds, sore throat and tinnitus.   Eyes: Negative for blurred vision, double vision, photophobia, pain, discharge and redness.  Respiratory: Negative for cough, hemoptysis, sputum production, shortness of breath, wheezing and stridor.   Cardiovascular: Negative for chest pain, palpitations, orthopnea, claudication, leg swelling and PND.  Gastrointestinal: Negative for heartburn, nausea, vomiting, abdominal pain, diarrhea, constipation, blood in stool and melena.  Genitourinary: Negative.   Musculoskeletal: Negative.   Skin: Negative.   Neurological: Negative for dizziness, tingling, focal weakness, seizures, weakness and headaches.  Endo/Heme/Allergies: Does not bruise/bleed easily.  Psychiatric/Behavioral: Negative for depression. The patient is not nervous/anxious and does not have insomnia.     As per HPI. Otherwise, a complete review of systems is  negative.  PAST MEDICAL HISTORY: Past Medical History:  Diagnosis Date  . Anemia   . Hypertension     PAST SURGICAL HISTORY: No past surgical history on file.  FAMILY HISTORY Family History  Problem Relation Age of Onset  . Diabetes Mother   . Breast cancer Neg Hx     GYNECOLOGIC HISTORY:  09/07/2015    HEALTH MAINTENANCE: Social History  Substance Use Topics  . Smoking status: Never Smoker  . Smokeless tobacco: Not on file  . Alcohol use 0.6 oz/week    1 Glasses of wine per week     Comment: once a month    No Known Allergies  Current Outpatient Prescriptions  Medication Sig Dispense Refill  . ferrous sulfate 325 (65 FE) MG EC tablet Take 325 mg by mouth 3 (three) times daily with meals.    . hydrochlorothiazide (HYDRODIURIL) 50 MG tablet Take 50 mg by mouth daily.     No current facility-administered medications for this visit.     OBJECTIVE: BP 136/90   Pulse 82   Temp 98.1 F (36.7 C) (Tympanic)   Resp 18   Wt (!) 307 lb 5.1 oz (139.4 kg)   BMI 56.21 kg/m    Body mass index is 56.21 kg/m.    ECOG FS:0 - Asymptomatic  General: Well nourished, no acute distress. Eyes: Pink conjunctiva, anicteric sclera. Lungs: Clear to auscultation bilaterally. Heart: Regular rate and rhythm. No rubs, murmurs, or gallops. Musculoskeletal: No edema, cyanosis, or clubbing. Neuro: Alert, answering all questions appropriately. Cranial nerves grossly intact. Skin: No rashes or petechiae noted. Psych: Normal affect.    LAB RESULTS:  No visits with results within 3 Day(s) from this visit.  Latest known visit with results is:  Appointment on 05/20/2016  Component Date Value Ref Range Status  .  WBC 05/20/2016 9.7  3.6 - 11.0 K/uL Final  . RBC 05/20/2016 4.17  3.80 - 5.20 MIL/uL Final  . Hemoglobin 05/20/2016 11.4* 12.0 - 16.0 g/dL Final  . HCT 05/20/2016 34.6* 35.0 - 47.0 % Final  . MCV 05/20/2016 82.9  80.0 - 100.0 fL Final  . MCH 05/20/2016 27.4  26.0 - 34.0 pg  Final  . MCHC 05/20/2016 33.1  32.0 - 36.0 g/dL Final  . RDW 05/20/2016 15.9* 11.5 - 14.5 % Final  . Platelets 05/20/2016 425  150 - 440 K/uL Final  . Neutrophils Relative % 05/20/2016 62  % Final  . Neutro Abs 05/20/2016 6.0  1.4 - 6.5 K/uL Final  . Lymphocytes Relative 05/20/2016 29  % Final  . Lymphs Abs 05/20/2016 2.8  1.0 - 3.6 K/uL Final  . Monocytes Relative 05/20/2016 5  % Final  . Monocytes Absolute 05/20/2016 0.5  0.2 - 0.9 K/uL Final  . Eosinophils Relative 05/20/2016 3  % Final  . Eosinophils Absolute 05/20/2016 0.3  0 - 0.7 K/uL Final  . Basophils Relative 05/20/2016 1  % Final  . Basophils Absolute 05/20/2016 0.1  0 - 0.1 K/uL Final  . Ferritin 05/20/2016 20  11 - 307 ng/mL Final  . Iron 05/20/2016 48  28 - 170 ug/dL Final  . TIBC 05/20/2016 332  250 - 450 ug/dL Final  . Saturation Ratios 05/20/2016 15  10.4 - 31.8 % Final  . UIBC 05/20/2016 284  ug/dL Final   Lab Results  Component Value Date   TOTALPROTELP 6.9 09/25/2015   ALBUMINELP 3.1 09/25/2015   A1GS 0.2 09/25/2015   A2GS 0.7 09/25/2015   BETS 1.2 09/25/2015   GAMS 1.6 09/25/2015   MSPIKE 0.7 (H) 09/25/2015   SPEI Comment 09/25/2015   Lab Results  Component Value Date   IRON 48 05/20/2016   TIBC 332 05/20/2016   IRONPCTSAT 15 05/20/2016   Lab Results  Component Value Date   FERRITIN 20 05/20/2016    STUDIES: No results found.  ASSESSMENT:  Iron deficiency anemia, MGUS.  PLAN:   1. Iron deficiency anemia: Likely secondary to patient's persistent heavy menstrual bleeding. Patient's hemoglobin Is nearly within normal limits. Iron stores are within normal limits. She does not require additional IV Feraheme today. She last received treatment on January 16, 2016. Proceed with one infusion of IV Feraheme today. Return to clinic in 6 months with repeat laboratory work and further evaluation. She has been instructed to continue her oral iron supplementation. 2. MGUS: Patient's most recent M spike on September 25, 2015 was reported as 0.7. No intervention is needed at this time. Repeat in 6 months. 3. Thrombocytosis: Resolved.  Patient expressed understanding and was in agreement with this plan. She also understands that She can call clinic at any time with any questions, concerns, or complaints.    Lloyd Huger, MD   05/31/2016 8:40 AM

## 2016-05-27 NOTE — Progress Notes (Signed)
Patient does not offer any problems today.  

## 2016-11-28 ENCOUNTER — Inpatient Hospital Stay: Payer: Self-pay | Attending: Oncology

## 2016-11-28 DIAGNOSIS — D472 Monoclonal gammopathy: Secondary | ICD-10-CM | POA: Insufficient documentation

## 2016-11-28 DIAGNOSIS — D500 Iron deficiency anemia secondary to blood loss (chronic): Secondary | ICD-10-CM | POA: Insufficient documentation

## 2016-11-28 DIAGNOSIS — Z79899 Other long term (current) drug therapy: Secondary | ICD-10-CM | POA: Insufficient documentation

## 2016-11-28 DIAGNOSIS — N920 Excessive and frequent menstruation with regular cycle: Secondary | ICD-10-CM | POA: Insufficient documentation

## 2016-11-28 LAB — CBC WITH DIFFERENTIAL/PLATELET
Basophils Absolute: 0.1 10*3/uL (ref 0–0.1)
Basophils Relative: 1 %
Eosinophils Absolute: 0.2 10*3/uL (ref 0–0.7)
Eosinophils Relative: 3 %
HEMATOCRIT: 35 % (ref 35.0–47.0)
HEMOGLOBIN: 11.5 g/dL — AB (ref 12.0–16.0)
LYMPHS PCT: 25 %
Lymphs Abs: 2 10*3/uL (ref 1.0–3.6)
MCH: 26 pg (ref 26.0–34.0)
MCHC: 32.7 g/dL (ref 32.0–36.0)
MCV: 79.3 fL — ABNORMAL LOW (ref 80.0–100.0)
MONOS PCT: 5 %
Monocytes Absolute: 0.4 10*3/uL (ref 0.2–0.9)
NEUTROS ABS: 5.4 10*3/uL (ref 1.4–6.5)
NEUTROS PCT: 66 %
Platelets: 451 10*3/uL — ABNORMAL HIGH (ref 150–440)
RBC: 4.42 MIL/uL (ref 3.80–5.20)
RDW: 24.5 % — ABNORMAL HIGH (ref 11.5–14.5)
WBC: 8.1 10*3/uL (ref 3.6–11.0)

## 2016-11-28 LAB — IRON AND TIBC
IRON: 114 ug/dL (ref 28–170)
SATURATION RATIOS: 32 % — AB (ref 10.4–31.8)
TIBC: 358 ug/dL (ref 250–450)
UIBC: 244 ug/dL

## 2016-11-28 LAB — BASIC METABOLIC PANEL
Anion gap: 5 (ref 5–15)
BUN: 14 mg/dL (ref 6–20)
CALCIUM: 9 mg/dL (ref 8.9–10.3)
CO2: 27 mmol/L (ref 22–32)
Chloride: 101 mmol/L (ref 101–111)
Creatinine, Ser: 0.82 mg/dL (ref 0.44–1.00)
GFR calc Af Amer: >60 mL/min (ref >60)
GLUCOSE: 127 mg/dL — AB (ref 65–99)
Potassium: 3.3 mmol/L — ABNORMAL LOW (ref 3.5–5.1)
Sodium: 133 mmol/L — ABNORMAL LOW (ref 135–145)

## 2016-11-28 LAB — FERRITIN: FERRITIN: 21 ng/mL (ref 11–307)

## 2016-11-29 LAB — PROTEIN ELECTROPHORESIS, SERUM
A/G RATIO SPE: 0.8 (ref 0.7–1.7)
ALPHA-2-GLOBULIN: 0.7 g/dL (ref 0.4–1.0)
Albumin ELP: 3.5 g/dL (ref 2.9–4.4)
Alpha-1-Globulin: 0.2 g/dL (ref 0.0–0.4)
BETA GLOBULIN: 1.3 g/dL (ref 0.7–1.3)
GAMMA GLOBULIN: 2.1 g/dL — AB (ref 0.4–1.8)
Globulin, Total: 4.4 g/dL — ABNORMAL HIGH (ref 2.2–3.9)
M-Spike, %: 1 g/dL — ABNORMAL HIGH
Total Protein ELP: 7.9 g/dL (ref 6.0–8.5)

## 2016-11-29 LAB — KAPPA/LAMBDA LIGHT CHAINS
KAPPA FREE LGHT CHN: 35.3 mg/L — AB (ref 3.3–19.4)
KAPPA, LAMDA LIGHT CHAIN RATIO: 1.28 (ref 0.26–1.65)
Lambda free light chains: 27.5 mg/L — ABNORMAL HIGH (ref 5.7–26.3)

## 2016-11-29 LAB — IGG, IGA, IGM
IGM, SERUM: 112 mg/dL (ref 26–217)
IgA: 430 mg/dL — ABNORMAL HIGH (ref 87–352)
IgG (Immunoglobin G), Serum: 1972 mg/dL — ABNORMAL HIGH (ref 700–1600)

## 2016-12-02 NOTE — Progress Notes (Signed)
[place]  Telephone:(336) [phone]  Fax:(336) [place] DOB: May 03, 1971  MR#: [id number]  [id number]#:[id number]  Date of service 02/22/2015.  Patient Care Team: Shepard General, MD as PCP - General (General Practice)  CHIEF COMPLAINT:  Iron deficiency anemia, MGUS.   INTERVAL HISTORY:  Patient returns to clinic today for repeat laboratory work, further evaluation, and consideration of additional IV iron. She continues to feel well and is asymptomatic. She does not complain of weakness or fatigue today. She has no neurologic complaints. She denies any recent fevers or illnesses. She denies any chest pain or shortness of breath. She denies any nausea, vomiting, constipation, or diarrhea. She has no melena or hematochezia. Patient feels at her baseline and offers no specific complaints today.   REVIEW OF SYSTEMS:   Review of Systems  Constitutional: Negative.  Negative for fever, malaise/fatigue and weight loss.  Respiratory: Negative.  Negative for cough.   Cardiovascular: Negative.  Negative for chest pain and leg swelling.  Gastrointestinal: Negative for abdominal pain.  Genitourinary: Negative.   Musculoskeletal: Negative.   Skin: Negative.  Negative for rash.  Neurological: Negative.  Negative for weakness.  Psychiatric/Behavioral: Negative.  The patient is not nervous/anxious.     As per HPI. Otherwise, a complete review of systems is negative.  PAST MEDICAL HISTORY: Past Medical History:  Diagnosis Date  . Anemia   . Hypertension     PAST SURGICAL HISTORY: No past surgical history on file.  FAMILY HISTORY Family History  Problem Relation Age of Onset  . Diabetes Mother   . Breast cancer Neg Hx     GYNECOLOGIC HISTORY:  09/07/2015    HEALTH MAINTENANCE: Social History  Substance Use Topics  . Smoking status: Never Smoker  . Smokeless tobacco: Not on file  . Alcohol use 0.6 oz/week    1 Glasses of wine per week     Comment:  once a month    No Known Allergies  Current Outpatient Prescriptions  Medication Sig Dispense Refill  . ferrous sulfate 325 (65 FE) MG EC tablet Take 325 mg by mouth 3 (three) times daily with meals.    . hydrochlorothiazide (HYDRODIURIL) 50 MG tablet Take 50 mg by mouth daily.     No current facility-administered medications for this visit.     OBJECTIVE: BP 123/79 (BP Location: Left Wrist, Patient Position: Sitting)   Pulse 96   Temp 98.3 F (36.8 C) (Tympanic)   Resp 16   Wt (!) 312 lb (141.5 kg)   BMI 57.07 kg/m    Body mass index is 57.07 kg/m.    ECOG FS:0 - Asymptomatic  General: Well nourished, no acute distress. Eyes: Pink conjunctiva, anicteric sclera. Lungs: Clear to auscultation bilaterally. Heart: Regular rate and rhythm. No rubs, murmurs, or gallops. Musculoskeletal: No edema, cyanosis, or clubbing. Neuro: Alert, answering all questions appropriately. Cranial nerves grossly intact. Skin: No rashes or petechiae noted. Psych: Normal affect.  LAB RESULTS:  CBC    Component Value Date/Time   WBC 8.1 11/28/2016 0950   RBC 4.42 11/28/2016 0950   HGB 11.5 (L) 11/28/2016 0950   HGB 10.6 (L) 09/23/2014 0820   HCT 35.0 11/28/2016 0950   HCT 33.2 (L) 09/23/2014 0820   PLT 451 (H) 11/28/2016 0950   PLT 420 09/23/2014 0820   MCV 79.3 (L) 11/28/2016 0950   MCV 87 09/23/2014 0820   MCH 26.0 11/28/2016 0950   MCHC 32.7 11/28/2016 0950   RDW 24.5 (  H) 11/28/2016 0950   RDW 16.4 (H) 09/23/2014 0820   LYMPHSABS 2.0 11/28/2016 0950   LYMPHSABS 2.2 09/23/2014 0820   MONOABS 0.4 11/28/2016 0950   MONOABS 0.5 09/23/2014 0820   EOSABS 0.2 11/28/2016 0950   EOSABS 0.4 09/23/2014 0820   BASOSABS 0.1 11/28/2016 0950   BASOSABS 0.1 09/23/2014 0820     Lab Results  Component Value Date   TOTALPROTELP 7.9 11/28/2016   ALBUMINELP 3.5 11/28/2016   A1GS 0.2 11/28/2016   A2GS 0.7 11/28/2016   BETS 1.3 11/28/2016   GAMS 2.1 (H) 11/28/2016   MSPIKE 1.0 (H) 11/28/2016     SPEI Comment 11/28/2016    Lab Results  Component Value Date   IRON 114 11/28/2016   TIBC 358 11/28/2016   IRONPCTSAT 32 (H) 11/28/2016   Lab Results  Component Value Date   FERRITIN 21 11/28/2016      STUDIES: No results found.  ASSESSMENT:  Iron deficiency anemia, MGUS.  PLAN:   1. Iron deficiency anemia: Likely secondary to patient's persistent heavy menstrual bleeding. Patient's hemoglobin is decreased, but essentially unchanged. Iron stores continue to be within normal limits. She does not require additional IV Feraheme today. She last received treatment on January 16, 2016. Return to clinic in 6 months with repeat laboratory work and further evaluation. She has been instructed to continue her oral iron supplementation. 2. MGUS: Patient's most recent M spike is 1.0. She has no elevated IgG as well as IgA, but her kappa lambda light chain ratio is within normal limits. No intervention is needed at this time. Repeat in 6 months. 3. Thrombocytosis: Resolved.  Patient expressed understanding and was in agreement with this plan. She also understands that She can call clinic at any time with any questions, concerns, or complaints.    Lloyd Huger, MD   12/08/2016 7:48 PM

## 2016-12-03 ENCOUNTER — Inpatient Hospital Stay: Payer: Self-pay | Attending: Oncology | Admitting: Oncology

## 2016-12-03 ENCOUNTER — Inpatient Hospital Stay: Payer: Self-pay

## 2016-12-03 VITALS — BP 123/79 | HR 96 | Temp 98.3°F | Resp 16 | Wt 312.0 lb

## 2016-12-03 DIAGNOSIS — I10 Essential (primary) hypertension: Secondary | ICD-10-CM | POA: Insufficient documentation

## 2016-12-03 DIAGNOSIS — D500 Iron deficiency anemia secondary to blood loss (chronic): Secondary | ICD-10-CM

## 2016-12-03 DIAGNOSIS — D509 Iron deficiency anemia, unspecified: Secondary | ICD-10-CM

## 2016-12-03 DIAGNOSIS — D472 Monoclonal gammopathy: Secondary | ICD-10-CM

## 2016-12-03 DIAGNOSIS — Z79899 Other long term (current) drug therapy: Secondary | ICD-10-CM

## 2016-12-03 NOTE — Progress Notes (Signed)
Patient here today for follow up.  Patient states no new concerns today  

## 2016-12-04 ENCOUNTER — Ambulatory Visit: Payer: Self-pay

## 2016-12-04 ENCOUNTER — Ambulatory Visit: Payer: Self-pay | Admitting: Oncology

## 2017-06-04 ENCOUNTER — Inpatient Hospital Stay: Payer: Self-pay | Attending: Oncology

## 2017-06-04 DIAGNOSIS — Z79899 Other long term (current) drug therapy: Secondary | ICD-10-CM | POA: Insufficient documentation

## 2017-06-04 DIAGNOSIS — D509 Iron deficiency anemia, unspecified: Secondary | ICD-10-CM | POA: Insufficient documentation

## 2017-06-04 DIAGNOSIS — I10 Essential (primary) hypertension: Secondary | ICD-10-CM | POA: Insufficient documentation

## 2017-06-04 DIAGNOSIS — D472 Monoclonal gammopathy: Secondary | ICD-10-CM

## 2017-06-04 DIAGNOSIS — D500 Iron deficiency anemia secondary to blood loss (chronic): Secondary | ICD-10-CM

## 2017-06-04 LAB — IRON AND TIBC
Iron: 124 ug/dL (ref 28–170)
SATURATION RATIOS: 34 % — AB (ref 10.4–31.8)
TIBC: 366 ug/dL (ref 250–450)
UIBC: 242 ug/dL

## 2017-06-04 LAB — BASIC METABOLIC PANEL
ANION GAP: 9 (ref 5–15)
BUN: 13 mg/dL (ref 6–20)
CALCIUM: 8.7 mg/dL — AB (ref 8.9–10.3)
CO2: 28 mmol/L (ref 22–32)
Chloride: 97 mmol/L — ABNORMAL LOW (ref 101–111)
Creatinine, Ser: 0.81 mg/dL (ref 0.44–1.00)
Glucose, Bld: 133 mg/dL — ABNORMAL HIGH (ref 65–99)
POTASSIUM: 2.8 mmol/L — AB (ref 3.5–5.1)
SODIUM: 134 mmol/L — AB (ref 135–145)

## 2017-06-04 LAB — CBC WITH DIFFERENTIAL/PLATELET
BASOS ABS: 0.1 10*3/uL (ref 0–0.1)
BASOS PCT: 1 %
EOS ABS: 0.3 10*3/uL (ref 0–0.7)
Eosinophils Relative: 4 %
HEMATOCRIT: 26.4 % — AB (ref 35.0–47.0)
HEMOGLOBIN: 8.1 g/dL — AB (ref 12.0–16.0)
Lymphocytes Relative: 23 %
Lymphs Abs: 1.8 10*3/uL (ref 1.0–3.6)
MCH: 22.7 pg — ABNORMAL LOW (ref 26.0–34.0)
MCHC: 30.6 g/dL — AB (ref 32.0–36.0)
MCV: 74.1 fL — ABNORMAL LOW (ref 80.0–100.0)
MONOS PCT: 5 %
Monocytes Absolute: 0.4 10*3/uL (ref 0.2–0.9)
NEUTROS ABS: 5.3 10*3/uL (ref 1.4–6.5)
NEUTROS PCT: 67 %
PLATELETS: 644 10*3/uL — AB (ref 150–440)
RBC: 3.56 MIL/uL — ABNORMAL LOW (ref 3.80–5.20)
RDW: 23.2 % — AB (ref 11.5–14.5)
WBC: 7.9 10*3/uL (ref 3.6–11.0)

## 2017-06-04 LAB — FERRITIN: FERRITIN: 11 ng/mL (ref 11–307)

## 2017-06-05 LAB — PROTEIN ELECTROPHORESIS, SERUM
A/G RATIO SPE: 0.8 (ref 0.7–1.7)
ALBUMIN ELP: 3.1 g/dL (ref 2.9–4.4)
ALPHA-1-GLOBULIN: 0.2 g/dL (ref 0.0–0.4)
Alpha-2-Globulin: 0.7 g/dL (ref 0.4–1.0)
Beta Globulin: 1.3 g/dL (ref 0.7–1.3)
GLOBULIN, TOTAL: 4.1 g/dL — AB (ref 2.2–3.9)
Gamma Globulin: 1.9 g/dL — ABNORMAL HIGH (ref 0.4–1.8)
M-Spike, %: 0.7 g/dL — ABNORMAL HIGH
Total Protein ELP: 7.2 g/dL (ref 6.0–8.5)

## 2017-06-05 LAB — IGG, IGA, IGM
IGA: 434 mg/dL — AB (ref 87–352)
IGG (IMMUNOGLOBIN G), SERUM: 1782 mg/dL — AB (ref 700–1600)
IgM (Immunoglobulin M), Srm: 89 mg/dL (ref 26–217)

## 2017-06-05 LAB — KAPPA/LAMBDA LIGHT CHAINS
KAPPA FREE LGHT CHN: 48.3 mg/L — AB (ref 3.3–19.4)
KAPPA, LAMDA LIGHT CHAIN RATIO: 2.01 — AB (ref 0.26–1.65)
LAMDA FREE LIGHT CHAINS: 24 mg/L (ref 5.7–26.3)

## 2017-06-10 NOTE — Progress Notes (Signed)
[place]  Telephone:(336) [phone]  Fax:([phone]) [place] DOB: [birth year]  MR#: [id number]  HKV#:[id number]  Date of service 02/22/2015.  Patient Care Team: Shepard General, MD as PCP - General (General Practice)  CHIEF COMPLAINT:  Iron deficiency anemia, MGUS.   INTERVAL HISTORY:  Patient returns to clinic today for repeat laboratory work, further evaluation, and consideration of additional IV iron. She has noticed some increased weakness and fatigue, but otherwise feels well. She has no neurologic complaints. She denies any recent fevers or illnesses. She denies any chest pain or shortness of breath. She denies any nausea, vomiting, constipation, or diarrhea. She has no melena or hematochezia. Patient offers no further specific complaints today.   REVIEW OF SYSTEMS:   Review of Systems  Constitutional: Positive for malaise/fatigue. Negative for fever and weight loss.  Respiratory: Negative.  Negative for cough.   Cardiovascular: Negative.  Negative for chest pain and leg swelling.  Gastrointestinal: Negative for abdominal pain, blood in stool and melena.  Genitourinary: Negative.  Negative for hematuria.  Musculoskeletal: Negative.   Skin: Negative.  Negative for rash.  Neurological: Positive for weakness.  Psychiatric/Behavioral: Negative.  The patient is not nervous/anxious.     As per HPI. Otherwise, a complete review of systems is negative.  PAST MEDICAL HISTORY: Past Medical History:  Diagnosis Date  . Anemia   . Hypertension     PAST SURGICAL HISTORY: No past surgical history on file.  FAMILY HISTORY Family History  Problem Relation Age of Onset  . Diabetes Mother   . Breast cancer Neg Hx     GYNECOLOGIC HISTORY:  09/07/2015    HEALTH MAINTENANCE: Social History   Tobacco Use  . Smoking status: Never Smoker  Substance Use Topics  . Alcohol use: Yes    Alcohol/week: 0.6 oz    Types: 1 Glasses of wine per week   Comment: once a month  . Drug use: No    No Known Allergies  Current Outpatient Medications  Medication Sig Dispense Refill  . ferrous sulfate 325 (65 FE) MG EC tablet Take 325 mg by mouth 3 (three) times daily with meals.    . hydrochlorothiazide (HYDRODIURIL) 50 MG tablet Take 50 mg by mouth daily.     No current facility-administered medications for this visit.     OBJECTIVE: BP (!) 152/84 (BP Location: Left Arm, Patient Position: Sitting)   Pulse 93   Temp 97.6 F (36.4 C) (Tympanic)   Resp 18   Wt (!) 312 lb 8 oz (141.7 kg)   BMI 57.16 kg/m    Body mass index is 57.16 kg/m.    ECOG FS:0 - Asymptomatic  General: Well nourished, no acute distress. Eyes: Pink conjunctiva, anicteric sclera. Lungs: Clear to auscultation bilaterally. Heart: Regular rate and rhythm. No rubs, murmurs, or gallops. Musculoskeletal: No edema, cyanosis, or clubbing. Neuro: Alert, answering all questions appropriately. Cranial nerves grossly intact. Skin: No rashes or petechiae noted. Psych: Normal affect.  LAB RESULTS:  CBC    Component Value Date/Time   WBC 7.9 06/04/2017 1014   RBC 3.56 (L) 06/04/2017 1014   HGB 8.1 (L) 06/04/2017 1014   HGB 10.6 (L) 09/23/2014 0820   HCT 26.4 (L) 06/04/2017 1014   HCT 33.2 (L) 09/23/2014 0820   PLT 644 (H) 06/04/2017 1014   PLT 420 09/23/2014 0820   MCV 74.1 (L) 06/04/2017 1014   MCV 87 09/23/2014 0820   MCH 22.7 (L) 06/04/2017 1014  MCHC 30.6 (L) 06/04/2017 1014   RDW 23.2 (H) 06/04/2017 1014   RDW 16.4 (H) 09/23/2014 0820   LYMPHSABS 1.8 06/04/2017 1014   LYMPHSABS 2.2 09/23/2014 0820   MONOABS 0.4 06/04/2017 1014   MONOABS 0.5 09/23/2014 0820   EOSABS 0.3 06/04/2017 1014   EOSABS 0.4 09/23/2014 0820   BASOSABS 0.1 06/04/2017 1014   BASOSABS 0.1 09/23/2014 0820     Lab Results  Component Value Date   TOTALPROTELP 7.2 06/04/2017   ALBUMINELP 3.1 06/04/2017   A1GS 0.2 06/04/2017   A2GS 0.7 06/04/2017   BETS 1.3 06/04/2017   GAMS 1.9  (H) 06/04/2017   MSPIKE 0.7 (H) 06/04/2017   SPEI Comment 06/04/2017    Lab Results  Component Value Date   IRON 124 06/04/2017   TIBC 366 06/04/2017   IRONPCTSAT 34 (H) 06/04/2017   Lab Results  Component Value Date   FERRITIN 11 06/04/2017      STUDIES: No results found.  ASSESSMENT:  Iron deficiency anemia, MGUS.  PLAN:   1. Iron deficiency anemia: Likely secondary to patient's persistent heavy menstrual bleeding. Patient's hemoglobin has trended down and her iron stores are borderline low.  Since she is mildly symptomatic, will proceed with 510 mg IV Feraheme next week.  She will also return in 2 weeks for second infusion.  Patient will then return to clinic in 3 months with repeat laboratory work and further evaluation. She has been instructed to continue her oral iron supplementation. 2. MGUS: Patient's most recent M spike is 0.7. She has an elevated IgG as well as IgA. No intervention is needed at this time. Repeat in 6 months. 3. Thrombocytosis: Likely secondary to iron deficiency.  Monitor.  Patient expressed understanding and was in agreement with this plan. She also understands that She can call clinic at any time with any questions, concerns, or complaints.    Lloyd Huger, MD   06/15/2017 8:36 AM

## 2017-06-11 ENCOUNTER — Inpatient Hospital Stay (HOSPITAL_BASED_OUTPATIENT_CLINIC_OR_DEPARTMENT_OTHER): Payer: Self-pay | Admitting: Oncology

## 2017-06-11 VITALS — BP 152/84 | HR 93 | Temp 97.6°F | Resp 18 | Wt 312.5 lb

## 2017-06-11 DIAGNOSIS — D500 Iron deficiency anemia secondary to blood loss (chronic): Secondary | ICD-10-CM

## 2017-06-11 DIAGNOSIS — D472 Monoclonal gammopathy: Secondary | ICD-10-CM

## 2017-06-11 DIAGNOSIS — D509 Iron deficiency anemia, unspecified: Secondary | ICD-10-CM

## 2017-06-11 DIAGNOSIS — Z79899 Other long term (current) drug therapy: Secondary | ICD-10-CM

## 2017-06-17 ENCOUNTER — Inpatient Hospital Stay: Payer: Self-pay

## 2017-06-17 DIAGNOSIS — D509 Iron deficiency anemia, unspecified: Secondary | ICD-10-CM

## 2017-06-17 MED ORDER — SODIUM CHLORIDE 0.9 % IV SOLN
Freq: Once | INTRAVENOUS | Status: AC
  Administered 2017-06-17: 14:00:00 via INTRAVENOUS
  Filled 2017-06-17: qty 1000

## 2017-06-17 MED ORDER — FERUMOXYTOL INJECTION 510 MG/17 ML
510.0000 mg | Freq: Once | INTRAVENOUS | Status: AC
  Administered 2017-06-17: 510 mg via INTRAVENOUS
  Filled 2017-06-17: qty 17

## 2017-06-25 ENCOUNTER — Inpatient Hospital Stay: Payer: Self-pay

## 2017-06-25 ENCOUNTER — Other Ambulatory Visit: Payer: Self-pay | Admitting: Oncology

## 2017-06-25 DIAGNOSIS — D508 Other iron deficiency anemias: Secondary | ICD-10-CM

## 2017-06-25 DIAGNOSIS — D500 Iron deficiency anemia secondary to blood loss (chronic): Secondary | ICD-10-CM

## 2017-06-25 MED ORDER — SODIUM CHLORIDE 0.9 % IV SOLN
Freq: Once | INTRAVENOUS | Status: DC
  Filled 2017-06-25: qty 1000

## 2017-06-25 MED ORDER — SODIUM CHLORIDE 0.9 % IV SOLN
510.0000 mg | Freq: Once | INTRAVENOUS | Status: AC
  Administered 2017-06-25: 510 mg via INTRAVENOUS
  Filled 2017-06-25: qty 17

## 2017-09-09 ENCOUNTER — Other Ambulatory Visit: Payer: Self-pay

## 2017-09-09 ENCOUNTER — Ambulatory Visit: Payer: Self-pay

## 2017-09-09 ENCOUNTER — Ambulatory Visit: Payer: Self-pay | Admitting: Oncology

## 2017-09-26 ENCOUNTER — Other Ambulatory Visit: Payer: Self-pay | Admitting: *Deleted

## 2017-09-26 DIAGNOSIS — D509 Iron deficiency anemia, unspecified: Secondary | ICD-10-CM

## 2017-10-02 ENCOUNTER — Ambulatory Visit: Payer: Self-pay

## 2017-10-02 ENCOUNTER — Other Ambulatory Visit: Payer: Self-pay

## 2017-10-02 ENCOUNTER — Ambulatory Visit: Payer: Self-pay | Admitting: Oncology

## 2017-10-12 NOTE — Progress Notes (Signed)
[place]  Telephone:(336) [phone]  Fax:(336) [place] DOB: 1970-08-17  MR#: [id number]  BTD#:[id number]  Date of service 02/22/2015.  Patient Care Team: Shepard General, MD as PCP - General (General Practice)  CHIEF COMPLAINT:  Iron deficiency anemia, MGUS.   INTERVAL HISTORY: Patient returns to clinic today for laboratory work and further evaluation.  She feels significantly improved since receiving IV iron in December 2018.  She does not complain of weakness or fatigue today.  She currently feels well and is asymptomatic.  She has no neurologic complaints. She denies any recent fevers or illnesses. She denies any chest pain or shortness of breath. She denies any nausea, vomiting, constipation, or diarrhea. She has no melena or hematochezia. Patient offers no specific complaints today.   REVIEW OF SYSTEMS:   Review of Systems  Constitutional: Negative.  Negative for fever, malaise/fatigue and weight loss.  Respiratory: Negative.  Negative for cough and shortness of breath.   Cardiovascular: Negative.  Negative for chest pain and leg swelling.  Gastrointestinal: Negative.  Negative for abdominal pain, blood in stool and melena.  Genitourinary: Negative.  Negative for hematuria.  Musculoskeletal: Negative.   Skin: Negative.  Negative for rash.  Neurological: Negative.  Negative for sensory change, focal weakness and weakness.  Psychiatric/Behavioral: Negative.  The patient is not nervous/anxious.     As per HPI. Otherwise, a complete review of systems is negative.  PAST MEDICAL HISTORY: Past Medical History:  Diagnosis Date  . Anemia   . Hypertension     PAST SURGICAL HISTORY: History reviewed. No pertinent surgical history.  FAMILY HISTORY Family History  Problem Relation Age of Onset  . Diabetes Mother   . Breast cancer Neg Hx     GYNECOLOGIC HISTORY:  09/07/2015    HEALTH MAINTENANCE: Social History   Tobacco Use  .  Smoking status: Never Smoker  Substance Use Topics  . Alcohol use: Yes    Alcohol/week: 0.6 oz    Types: 1 Glasses of wine per week    Comment: once a month  . Drug use: No    No Known Allergies  Current Outpatient Medications  Medication Sig Dispense Refill  . ferrous sulfate 325 (65 FE) MG EC tablet Take 325 mg by mouth 3 (three) times daily with meals.    . hydrochlorothiazide (HYDRODIURIL) 50 MG tablet Take 50 mg by mouth daily.     No current facility-administered medications for this visit.     OBJECTIVE: BP 138/85   Pulse 79   Temp (!) 97.1 F (36.2 C) (Tympanic)   Resp 20   Wt (!) 318 lb 3.2 oz (144.3 kg)   BMI 58.20 kg/m    Body mass index is 58.2 kg/m.    ECOG FS:0 - Asymptomatic  General: Well-developed, well-nourished, no acute distress. Eyes: Pink conjunctiva, anicteric sclera. Lungs: Clear to auscultation bilaterally. Heart: Regular rate and rhythm. No rubs, murmurs, or gallops. Abdomen: Soft, nontender, nondistended. No organomegaly noted, normoactive bowel sounds. Musculoskeletal: No edema, cyanosis, or clubbing. Neuro: Alert, answering all questions appropriately. Cranial nerves grossly intact. Skin: No rashes or petechiae noted. Psych: Normal affect.   LAB RESULTS:  CBC    Component Value Date/Time   WBC 8.5 10/13/2017 1340   RBC 4.28 10/13/2017 1340   HGB 11.9 (L) 10/13/2017 1340   HGB 10.6 (L) 09/23/2014 0820   HCT 36.3 10/13/2017 1340   HCT 33.2 (L) 09/23/2014 0820   PLT 461 (H) 10/13/2017 1340  PLT 420 09/23/2014 0820   MCV 84.8 10/13/2017 1340   MCV 87 09/23/2014 0820   MCH 27.7 10/13/2017 1340   MCHC 32.7 10/13/2017 1340   RDW 16.2 (H) 10/13/2017 1340   RDW 16.4 (H) 09/23/2014 0820   LYMPHSABS 2.3 10/13/2017 1340   LYMPHSABS 2.2 09/23/2014 0820   MONOABS 0.4 10/13/2017 1340   MONOABS 0.5 09/23/2014 0820   EOSABS 0.4 10/13/2017 1340   EOSABS 0.4 09/23/2014 0820   BASOSABS 0.1 10/13/2017 1340   BASOSABS 0.1 09/23/2014 0820      Lab Results  Component Value Date   TOTALPROTELP 7.2 06/04/2017   ALBUMINELP 3.1 06/04/2017   A1GS 0.2 06/04/2017   A2GS 0.7 06/04/2017   BETS 1.3 06/04/2017   GAMS 1.9 (H) 06/04/2017   MSPIKE 0.7 (H) 06/04/2017   SPEI Comment 06/04/2017    Lab Results  Component Value Date   IRON 94 10/13/2017   TIBC 322 10/13/2017   IRONPCTSAT 29 10/13/2017   Lab Results  Component Value Date   FERRITIN 19 10/13/2017      STUDIES: No results found.  ASSESSMENT:  Iron deficiency anemia, MGUS.  PLAN:   1. Iron deficiency anemia: Likely secondary to patient's persistent heavy menstrual bleeding.  Patient's hemoglobin is significantly improved and she is no longer symptomatic.  Her iron stores are within normal limits.  She does not require IV Feraheme today.  She has been instructed to continue her oral iron supplementation.  Return to clinic in 3 months with repeat laboratory work and further evaluation. 2. MGUS: Patient's most recent M spike is 0.7. She has an elevated IgG as well as IgA. No intervention is needed at this time.  Patient does not require bone marrow biopsy.  Repeat in 6 months. 3. Thrombocytosis: Likely secondary to iron deficiency.  Monitor.  Approximately 20 minutes spent in discussion of which greater than 50% consultation.  Patient expressed understanding and was in agreement with this plan. She also understands that She can call clinic at any time with any questions, concerns, or complaints.    Lloyd Huger, MD   10/14/2017 9:05 AM

## 2017-10-13 ENCOUNTER — Encounter: Payer: Self-pay | Admitting: Oncology

## 2017-10-13 ENCOUNTER — Inpatient Hospital Stay: Payer: Self-pay | Attending: Oncology

## 2017-10-13 ENCOUNTER — Inpatient Hospital Stay: Payer: Self-pay

## 2017-10-13 ENCOUNTER — Other Ambulatory Visit: Payer: Self-pay

## 2017-10-13 ENCOUNTER — Inpatient Hospital Stay (HOSPITAL_BASED_OUTPATIENT_CLINIC_OR_DEPARTMENT_OTHER): Payer: Self-pay | Admitting: Oncology

## 2017-10-13 VITALS — BP 138/85 | HR 79 | Temp 97.1°F | Resp 20 | Wt 318.2 lb

## 2017-10-13 DIAGNOSIS — D500 Iron deficiency anemia secondary to blood loss (chronic): Secondary | ICD-10-CM

## 2017-10-13 DIAGNOSIS — D509 Iron deficiency anemia, unspecified: Secondary | ICD-10-CM

## 2017-10-13 DIAGNOSIS — I10 Essential (primary) hypertension: Secondary | ICD-10-CM

## 2017-10-13 DIAGNOSIS — D472 Monoclonal gammopathy: Secondary | ICD-10-CM

## 2017-10-13 LAB — FERRITIN: FERRITIN: 19 ng/mL (ref 11–307)

## 2017-10-13 LAB — CBC WITH DIFFERENTIAL/PLATELET
BASOS ABS: 0.1 10*3/uL (ref 0–0.1)
BASOS PCT: 1 %
EOS ABS: 0.4 10*3/uL (ref 0–0.7)
Eosinophils Relative: 5 %
HEMATOCRIT: 36.3 % (ref 35.0–47.0)
HEMOGLOBIN: 11.9 g/dL — AB (ref 12.0–16.0)
Lymphocytes Relative: 27 %
Lymphs Abs: 2.3 10*3/uL (ref 1.0–3.6)
MCH: 27.7 pg (ref 26.0–34.0)
MCHC: 32.7 g/dL (ref 32.0–36.0)
MCV: 84.8 fL (ref 80.0–100.0)
Monocytes Absolute: 0.4 10*3/uL (ref 0.2–0.9)
Monocytes Relative: 5 %
NEUTROS ABS: 5.3 10*3/uL (ref 1.4–6.5)
NEUTROS PCT: 62 %
Platelets: 461 10*3/uL — ABNORMAL HIGH (ref 150–440)
RBC: 4.28 MIL/uL (ref 3.80–5.20)
RDW: 16.2 % — ABNORMAL HIGH (ref 11.5–14.5)
WBC: 8.5 10*3/uL (ref 3.6–11.0)

## 2017-10-13 LAB — IRON AND TIBC
IRON: 94 ug/dL (ref 28–170)
SATURATION RATIOS: 29 % (ref 10.4–31.8)
TIBC: 322 ug/dL (ref 250–450)
UIBC: 228 ug/dL

## 2017-10-13 NOTE — Progress Notes (Signed)
Patient denies any concerns today.  

## 2017-10-14 ENCOUNTER — Ambulatory Visit: Payer: Self-pay | Admitting: Oncology

## 2017-10-14 ENCOUNTER — Other Ambulatory Visit: Payer: Self-pay

## 2018-01-07 ENCOUNTER — Encounter: Payer: Self-pay | Admitting: Pharmacy Technician

## 2018-01-07 NOTE — Progress Notes (Unsigned)
Patient has been approved for drug assistance by [employer] for Feraheme. The enrollment period is from 01/06/18-01/07/19 based on self pay. First DOS covered is 01/14/18.

## 2018-01-11 NOTE — Progress Notes (Signed)
[place]  Telephone:(336) [phone]  Fax:(336) [place] DOB: [birth year]  MR#: [id number]  [id number]#:[id number]  Date of service 02/22/2015.  Patient Care Team: Shepard General, MD as PCP - General (General Practice)  CHIEF COMPLAINT:  Iron deficiency anemia, MGUS.  INTERVAL HISTORY: Patient returns to clinic today for repeat laboratory work and further evaluation.  She currently feels well and is asymptomatic.  She does not complain of weakness or fatigue.  She is tolerating oral iron supplementation without significant side effects. She has no neurologic complaints. She denies any recent fevers or illnesses. She denies any chest pain or shortness of breath. She denies any nausea, vomiting, constipation, or diarrhea. She has no melena or hematochezia.  Patient feels that her baseline offers no specific complaints today.  REVIEW OF SYSTEMS:   Review of Systems  Constitutional: Negative.  Negative for fever, malaise/fatigue and weight loss.  Respiratory: Negative.  Negative for cough and shortness of breath.   Cardiovascular: Negative.  Negative for chest pain and leg swelling.  Gastrointestinal: Negative.  Negative for abdominal pain, blood in stool and melena.  Genitourinary: Negative.  Negative for hematuria.  Musculoskeletal: Negative.  Negative for back pain.  Skin: Negative.  Negative for rash.  Neurological: Negative.  Negative for sensory change, focal weakness and weakness.  Psychiatric/Behavioral: Negative.  The patient is not nervous/anxious.     As per HPI. Otherwise, a complete review of systems is negative.  PAST MEDICAL HISTORY: Past Medical History:  Diagnosis Date  . Anemia   . Hypertension     PAST SURGICAL HISTORY: Past Surgical History:  Procedure Laterality Date  . CESAREAN SECTION  1999  . CESAREAN SECTION  07/1991    FAMILY HISTORY Family History  Problem Relation Age of Onset  . Diabetes Mother   . Breast cancer  Neg Hx     GYNECOLOGIC HISTORY:  09/07/2015    HEALTH MAINTENANCE: Social History   Tobacco Use  . Smoking status: Never Smoker  . Smokeless tobacco: Never Used  Substance Use Topics  . Alcohol use: Yes    Alcohol/week: 0.6 oz    Types: 1 Glasses of wine per week    Comment: once a month  . Drug use: No    No Known Allergies  Current Outpatient Medications  Medication Sig Dispense Refill  . ferrous sulfate 325 (65 FE) MG EC tablet Take 325 mg by mouth 3 (three) times daily with meals.    . hydrochlorothiazide (HYDRODIURIL) 50 MG tablet Take 50 mg by mouth daily.     No current facility-administered medications for this visit.     OBJECTIVE: BP (!) 142/84   Pulse 91   Temp (!) 97.4 F (36.3 C) (Tympanic)   Resp 18   Ht '5\' 2"'$  (1.575 m)   Wt (!) 322 lb 14.4 oz (146.5 kg)   BMI 59.06 kg/m    Body mass index is 59.06 kg/m.    ECOG FS:0 - Asymptomatic  General: Well-developed, well-nourished, no acute distress. Eyes: Pink conjunctiva, anicteric sclera. HEENT: Normocephalic, moist mucous membranes. Lungs: Clear to auscultation bilaterally. Heart: Regular rate and rhythm. No rubs, murmurs, or gallops. Abdomen: Soft, nontender, nondistended. No organomegaly noted, normoactive bowel sounds. Musculoskeletal: No edema, cyanosis, or clubbing. Neuro: Alert, answering all questions appropriately. Cranial nerves grossly intact. Skin: No rashes or petechiae noted. Psych: Normal affect.  LAB RESULTS:  CBC    Component Value Date/Time   WBC 9.9 01/14/2018 1320  RBC 4.20 01/14/2018 1320   HGB 11.0 (L) 01/14/2018 1320   HGB 10.6 (L) 09/23/2014 0820   HCT 34.4 (L) 01/14/2018 1320   HCT 33.2 (L) 09/23/2014 0820   PLT 411 01/14/2018 1320   PLT 420 09/23/2014 0820   MCV 81.8 01/14/2018 1320   MCV 87 09/23/2014 0820   MCH 26.2 01/14/2018 1320   MCHC 32.1 01/14/2018 1320   RDW 18.7 (H) 01/14/2018 1320   RDW 16.4 (H) 09/23/2014 0820   LYMPHSABS 2.9 01/14/2018 1320    LYMPHSABS 2.2 09/23/2014 0820   MONOABS 0.4 01/14/2018 1320   MONOABS 0.5 09/23/2014 0820   EOSABS 0.3 01/14/2018 1320   EOSABS 0.4 09/23/2014 0820   BASOSABS 0.1 01/14/2018 1320   BASOSABS 0.1 09/23/2014 0820     Lab Results  Component Value Date   TOTALPROTELP 7.2 06/04/2017   ALBUMINELP 3.1 06/04/2017   A1GS 0.2 06/04/2017   A2GS 0.7 06/04/2017   BETS 1.3 06/04/2017   GAMS 1.9 (H) 06/04/2017   MSPIKE 0.7 (H) 06/04/2017   SPEI Comment 06/04/2017    Lab Results  Component Value Date   IRON 25 (L) 01/14/2018   TIBC 327 01/14/2018   IRONPCTSAT 8 (L) 01/14/2018   Lab Results  Component Value Date   FERRITIN 32 01/14/2018      STUDIES: No results found.  ASSESSMENT:  Iron deficiency anemia, MGUS.  PLAN:   1. Iron deficiency anemia: Patient's hemoglobin has trended down, but she remains symptomatic.  Her iron stores continue to be decreased.  She does not require IV Feraheme today, but will likely need treatment at her next clinic visit.  Patient has been instructed to continue oral iron supplementation.  Return to clinic in 3 months with repeat laboratory work and further evaluation.   2. MGUS: Patient's most recent M spike on June 04, 2017 was reported 0.7 which is approximately her baseline. She has an elevated IgG as well as IgA.  She does not have any evidence of endorgan damage.  No intervention is needed at this time.  Patient does not require bone marrow biopsy.  Repeat in 6 months. 3. Thrombocytosis: Resolved.   Patient expressed understanding and was in agreement with this plan. She also understands that She can call clinic at any time with any questions, concerns, or complaints.    Lloyd Huger, MD   01/19/2018 11:07 AM

## 2018-01-12 ENCOUNTER — Ambulatory Visit: Payer: Self-pay | Admitting: Oncology

## 2018-01-12 ENCOUNTER — Ambulatory Visit: Payer: Self-pay

## 2018-01-12 ENCOUNTER — Other Ambulatory Visit: Payer: Self-pay

## 2018-01-13 ENCOUNTER — Other Ambulatory Visit: Payer: Self-pay | Admitting: *Deleted

## 2018-01-13 DIAGNOSIS — D649 Anemia, unspecified: Secondary | ICD-10-CM

## 2018-01-13 NOTE — Progress Notes (Signed)
cb

## 2018-01-14 ENCOUNTER — Inpatient Hospital Stay: Payer: Self-pay

## 2018-01-14 ENCOUNTER — Encounter (INDEPENDENT_AMBULATORY_CARE_PROVIDER_SITE_OTHER): Payer: Self-pay

## 2018-01-14 ENCOUNTER — Encounter: Payer: Self-pay | Admitting: Oncology

## 2018-01-14 ENCOUNTER — Inpatient Hospital Stay (HOSPITAL_BASED_OUTPATIENT_CLINIC_OR_DEPARTMENT_OTHER): Payer: Self-pay | Admitting: Oncology

## 2018-01-14 ENCOUNTER — Inpatient Hospital Stay: Payer: Self-pay | Attending: Oncology

## 2018-01-14 VITALS — BP 142/84 | HR 91 | Temp 97.4°F | Resp 18 | Ht 62.0 in | Wt 322.9 lb

## 2018-01-14 DIAGNOSIS — D509 Iron deficiency anemia, unspecified: Secondary | ICD-10-CM | POA: Insufficient documentation

## 2018-01-14 DIAGNOSIS — D472 Monoclonal gammopathy: Secondary | ICD-10-CM | POA: Insufficient documentation

## 2018-01-14 DIAGNOSIS — D649 Anemia, unspecified: Secondary | ICD-10-CM

## 2018-01-14 DIAGNOSIS — D500 Iron deficiency anemia secondary to blood loss (chronic): Secondary | ICD-10-CM

## 2018-01-14 LAB — CBC WITH DIFFERENTIAL/PLATELET
BASOS ABS: 0.1 10*3/uL (ref 0–0.1)
BASOS PCT: 1 %
EOS PCT: 3 %
Eosinophils Absolute: 0.3 10*3/uL (ref 0–0.7)
HCT: 34.4 % — ABNORMAL LOW (ref 35.0–47.0)
Hemoglobin: 11 g/dL — ABNORMAL LOW (ref 12.0–16.0)
LYMPHS PCT: 29 %
Lymphs Abs: 2.9 10*3/uL (ref 1.0–3.6)
MCH: 26.2 pg (ref 26.0–34.0)
MCHC: 32.1 g/dL (ref 32.0–36.0)
MCV: 81.8 fL (ref 80.0–100.0)
MONO ABS: 0.4 10*3/uL (ref 0.2–0.9)
Monocytes Relative: 4 %
NEUTROS ABS: 6.3 10*3/uL (ref 1.4–6.5)
Neutrophils Relative %: 63 %
PLATELETS: 411 10*3/uL (ref 150–440)
RBC: 4.2 MIL/uL (ref 3.80–5.20)
RDW: 18.7 % — AB (ref 11.5–14.5)
WBC: 9.9 10*3/uL (ref 3.6–11.0)

## 2018-01-14 LAB — IRON AND TIBC
Iron: 25 ug/dL — ABNORMAL LOW (ref 28–170)
Saturation Ratios: 8 % — ABNORMAL LOW (ref 10.4–31.8)
TIBC: 327 ug/dL (ref 250–450)
UIBC: 303 ug/dL

## 2018-01-14 LAB — FERRITIN: FERRITIN: 32 ng/mL (ref 11–307)

## 2018-01-14 NOTE — Progress Notes (Signed)
Pt doing good, no fatigue. Tolerating iron pills without problems.

## 2018-04-20 ENCOUNTER — Inpatient Hospital Stay: Payer: Self-pay

## 2018-04-27 ENCOUNTER — Ambulatory Visit: Payer: Self-pay

## 2018-04-27 ENCOUNTER — Ambulatory Visit: Payer: Self-pay | Admitting: Oncology

## 2018-04-28 ENCOUNTER — Inpatient Hospital Stay: Payer: Self-pay | Attending: Oncology

## 2018-04-28 DIAGNOSIS — D509 Iron deficiency anemia, unspecified: Secondary | ICD-10-CM | POA: Insufficient documentation

## 2018-04-28 DIAGNOSIS — D472 Monoclonal gammopathy: Secondary | ICD-10-CM | POA: Insufficient documentation

## 2018-04-28 DIAGNOSIS — D500 Iron deficiency anemia secondary to blood loss (chronic): Secondary | ICD-10-CM

## 2018-04-28 LAB — BASIC METABOLIC PANEL
ANION GAP: 4 — AB (ref 5–15)
BUN: 12 mg/dL (ref 6–20)
CALCIUM: 8.8 mg/dL — AB (ref 8.9–10.3)
CO2: 24 mmol/L (ref 22–32)
CREATININE: 0.71 mg/dL (ref 0.44–1.00)
Chloride: 107 mmol/L (ref 98–111)
GFR calc Af Amer: >60 mL/min (ref >60)
Glucose, Bld: 117 mg/dL — ABNORMAL HIGH (ref 70–99)
Potassium: 3.7 mmol/L (ref 3.5–5.1)
SODIUM: 135 mmol/L (ref 135–145)

## 2018-04-28 LAB — CBC
HCT: 31.8 % — ABNORMAL LOW (ref 36.0–46.0)
Hemoglobin: 9.2 g/dL — ABNORMAL LOW (ref 12.0–15.0)
MCH: 23.2 pg — AB (ref 26.0–34.0)
MCHC: 28.9 g/dL — ABNORMAL LOW (ref 30.0–36.0)
MCV: 80.1 fL (ref 80.0–100.0)
PLATELETS: 520 10*3/uL — AB (ref 150–400)
RBC: 3.97 MIL/uL (ref 3.87–5.11)
RDW: 20.1 % — ABNORMAL HIGH (ref 11.5–15.5)
WBC: 7.8 10*3/uL (ref 4.0–10.5)
nRBC: 0 % (ref 0.0–0.2)

## 2018-04-28 LAB — IRON AND TIBC
IRON: 20 ug/dL — AB (ref 28–170)
Saturation Ratios: 5 % — ABNORMAL LOW (ref 10.4–31.8)
TIBC: 378 ug/dL (ref 250–450)
UIBC: 358 ug/dL

## 2018-04-28 LAB — FERRITIN: Ferritin: 12 ng/mL (ref 11–307)

## 2018-04-29 LAB — PROTEIN ELECTROPHORESIS, SERUM
A/G Ratio: 0.9 (ref 0.7–1.7)
ALPHA-1-GLOBULIN: 0.2 g/dL (ref 0.0–0.4)
Albumin ELP: 3.4 g/dL (ref 2.9–4.4)
Alpha-2-Globulin: 0.6 g/dL (ref 0.4–1.0)
Beta Globulin: 1.2 g/dL (ref 0.7–1.3)
GAMMA GLOBULIN: 1.7 g/dL (ref 0.4–1.8)
Globulin, Total: 3.7 g/dL (ref 2.2–3.9)
M-Spike, %: 0.8 g/dL — ABNORMAL HIGH
TOTAL PROTEIN ELP: 7.1 g/dL (ref 6.0–8.5)

## 2018-04-29 LAB — KAPPA/LAMBDA LIGHT CHAINS
KAPPA FREE LGHT CHN: 36.7 mg/L — AB (ref 3.3–19.4)
KAPPA, LAMDA LIGHT CHAIN RATIO: 1.64 (ref 0.26–1.65)
LAMDA FREE LIGHT CHAINS: 22.4 mg/L (ref 5.7–26.3)

## 2018-04-29 LAB — IGG, IGA, IGM
IgA: 414 mg/dL — ABNORMAL HIGH (ref 87–352)
IgG (Immunoglobin G), Serum: 1756 mg/dL — ABNORMAL HIGH (ref 700–1600)
IgM (Immunoglobulin M), Srm: 88 mg/dL (ref 26–217)

## 2018-05-03 NOTE — Progress Notes (Signed)
[place]  Telephone:(336) [phone]  Fax:(336) [place] DOB: [birth year]  MR#: [id number]  PVX#:[id number]  Date of service 02/22/2015.  Patient Care Team: Shepard General, MD as PCP - General (General Practice)  CHIEF COMPLAINT:  Iron deficiency anemia, MGUS.  INTERVAL HISTORY: Patient returns to clinic today for repeat laboratory can further evaluation.  She has noticed some increased weakness and fatigue, but otherwise feels well.  She has no neurologic complaints. She denies any recent fevers or illnesses. She denies any chest pain or shortness of breath. She denies any nausea, vomiting, constipation, or diarrhea. She has no melena or hematochezia.  She has no urinary complaints.  Patient offers no further specific complaints today.    REVIEW OF SYSTEMS:   Review of Systems  Constitutional: Negative.  Negative for fever, malaise/fatigue and weight loss.  Respiratory: Negative.  Negative for cough and shortness of breath.   Cardiovascular: Negative.  Negative for chest pain and leg swelling.  Gastrointestinal: Negative.  Negative for abdominal pain, blood in stool and melena.  Genitourinary: Negative.  Negative for hematuria.  Musculoskeletal: Negative.  Negative for back pain.  Skin: Negative.  Negative for rash.  Neurological: Negative.  Negative for sensory change, focal weakness, weakness and headaches.  Psychiatric/Behavioral: Negative.  The patient is not nervous/anxious.     As per HPI. Otherwise, a complete review of systems is negative.  PAST MEDICAL HISTORY: Past Medical History:  Diagnosis Date  . Anemia   . Hypertension     PAST SURGICAL HISTORY: Past Surgical History:  Procedure Laterality Date  . CESAREAN SECTION  1999  . CESAREAN SECTION  07/1991    FAMILY HISTORY Family History  Problem Relation Age of Onset  . Diabetes Mother   . Breast cancer Neg Hx     GYNECOLOGIC HISTORY:  09/07/2015    HEALTH  MAINTENANCE: Social History   Tobacco Use  . Smoking status: Never Smoker  . Smokeless tobacco: Never Used  Substance Use Topics  . Alcohol use: Yes    Alcohol/week: 1.0 standard drinks    Types: 1 Glasses of wine per week    Comment: once a month  . Drug use: No    No Known Allergies  Current Outpatient Medications  Medication Sig Dispense Refill  . ferrous sulfate 325 (65 FE) MG EC tablet Take 325 mg by mouth 3 (three) times daily with meals.    . hydrochlorothiazide (HYDRODIURIL) 50 MG tablet Take 50 mg by mouth daily.     No current facility-administered medications for this visit.     OBJECTIVE: BP (!) 159/96 (BP Location: Left Arm, Patient Position: Sitting)   Pulse 98   Temp (!) 97.1 F (36.2 C) (Tympanic)   Resp 18   Wt (!) 322 lb 6.4 oz (146.2 kg)   BMI 58.97 kg/m    Body mass index is 58.97 kg/m.    ECOG FS:0 - Asymptomatic  General: Well-developed, well-nourished, no acute distress. Eyes: Pink conjunctiva, anicteric sclera. HEENT: Normocephalic, moist mucous membranes. Lungs: Clear to auscultation bilaterally. Heart: Regular rate and rhythm. No rubs, murmurs, or gallops. Abdomen: Soft, nontender, nondistended. No organomegaly noted, normoactive bowel sounds. Musculoskeletal: No edema, cyanosis, or clubbing. Neuro: Alert, answering all questions appropriately. Cranial nerves grossly intact. Skin: No rashes or petechiae noted. Psych: Normal affect.  LAB RESULTS:  CBC    Component Value Date/Time   WBC 7.8 04/28/2018 1423   RBC 3.97 04/28/2018 1423   HGB 9.2 (  L) 04/28/2018 1423   HGB 10.6 (L) 09/23/2014 0820   HCT 31.8 (L) 04/28/2018 1423   HCT 33.2 (L) 09/23/2014 0820   PLT 520 (H) 04/28/2018 1423   PLT 420 09/23/2014 0820   MCV 80.1 04/28/2018 1423   MCV 87 09/23/2014 0820   MCH 23.2 (L) 04/28/2018 1423   MCHC 28.9 (L) 04/28/2018 1423   RDW 20.1 (H) 04/28/2018 1423   RDW 16.4 (H) 09/23/2014 0820   LYMPHSABS 2.9 01/14/2018 1320   LYMPHSABS  2.2 09/23/2014 0820   MONOABS 0.4 01/14/2018 1320   MONOABS 0.5 09/23/2014 0820   EOSABS 0.3 01/14/2018 1320   EOSABS 0.4 09/23/2014 0820   BASOSABS 0.1 01/14/2018 1320   BASOSABS 0.1 09/23/2014 0820     Lab Results  Component Value Date   TOTALPROTELP 7.1 04/28/2018   ALBUMINELP 3.4 04/28/2018   A1GS 0.2 04/28/2018   A2GS 0.6 04/28/2018   BETS 1.2 04/28/2018   GAMS 1.7 04/28/2018   MSPIKE 0.8 (H) 04/28/2018   SPEI Comment 04/28/2018    Lab Results  Component Value Date   IRON 20 (L) 04/28/2018   TIBC 378 04/28/2018   IRONPCTSAT 5 (L) 04/28/2018   Lab Results  Component Value Date   FERRITIN 12 04/28/2018      STUDIES: No results found.  ASSESSMENT:  Iron deficiency anemia, MGUS.  PLAN:   1. Iron deficiency anemia: Patient's hemoglobin and iron stores have trended down and she is now symptomatic.  Previously, other than a positive SPEP all of her other laboratory work was either negative or within normal limits.  Proceed with 510 mg IV Feraheme today.  Patient will return to clinic in 1 week for consideration of a second infusion.  She will then return to clinic in 3 months for repeat laboratory work and further evaluation.    2. MGUS: Patient's most recent M spike on April 28, 2018 reported at 0.8 which is unchanged from August 2016.  Patient has an IgG prominence which is less likely to progress to overt multiple myeloma.  Her kappa/lambda light chain ratio is within normal limits.  She has no evidence of endorgan damage.  No intervention is needed at this time.  Patient does not need metastatic bone survey or bone marrow biopsy. Continue to monitor on a yearly basis. 3.  Thrombocytosis: Secondary to iron deficiency.  IV Feraheme as above.   Patient expressed understanding and was in agreement with this plan. She also understands that She can call clinic at any time with any questions, concerns, or complaints.    Lloyd Huger, MD   05/06/2018 8:42 AM

## 2018-05-05 ENCOUNTER — Inpatient Hospital Stay: Payer: Self-pay | Attending: Oncology | Admitting: Oncology

## 2018-05-05 ENCOUNTER — Inpatient Hospital Stay: Payer: Self-pay

## 2018-05-05 ENCOUNTER — Other Ambulatory Visit: Payer: Self-pay

## 2018-05-05 VITALS — BP 160/82 | HR 90 | Resp 18

## 2018-05-05 VITALS — BP 159/96 | HR 98 | Temp 97.1°F | Resp 18 | Wt 322.4 lb

## 2018-05-05 DIAGNOSIS — D509 Iron deficiency anemia, unspecified: Secondary | ICD-10-CM | POA: Insufficient documentation

## 2018-05-05 DIAGNOSIS — D472 Monoclonal gammopathy: Secondary | ICD-10-CM | POA: Insufficient documentation

## 2018-05-05 DIAGNOSIS — R5383 Other fatigue: Secondary | ICD-10-CM | POA: Insufficient documentation

## 2018-05-05 DIAGNOSIS — D500 Iron deficiency anemia secondary to blood loss (chronic): Secondary | ICD-10-CM

## 2018-05-05 DIAGNOSIS — R531 Weakness: Secondary | ICD-10-CM | POA: Insufficient documentation

## 2018-05-05 DIAGNOSIS — R7989 Other specified abnormal findings of blood chemistry: Secondary | ICD-10-CM | POA: Insufficient documentation

## 2018-05-05 DIAGNOSIS — I10 Essential (primary) hypertension: Secondary | ICD-10-CM | POA: Insufficient documentation

## 2018-05-05 MED ORDER — SODIUM CHLORIDE 0.9 % IV SOLN
Freq: Once | INTRAVENOUS | Status: AC
  Administered 2018-05-05: 15:00:00 via INTRAVENOUS
  Filled 2018-05-05: qty 250

## 2018-05-05 MED ORDER — SODIUM CHLORIDE 0.9 % IV SOLN
510.0000 mg | Freq: Once | INTRAVENOUS | Status: AC
  Administered 2018-05-05: 510 mg via INTRAVENOUS
  Filled 2018-05-05: qty 17

## 2018-05-05 NOTE — Progress Notes (Signed)
Here for follow up. Stated energy level -feels like an 8 on energy scale ( 10 best energy ever )  Overall doing " ok "  Mild sob only if she climbs a lot of stairs per pt

## 2018-05-12 ENCOUNTER — Inpatient Hospital Stay: Payer: Self-pay

## 2018-05-13 ENCOUNTER — Inpatient Hospital Stay: Payer: Self-pay

## 2018-05-13 VITALS — BP 141/82 | HR 101 | Temp 97.4°F | Resp 19

## 2018-05-13 DIAGNOSIS — D500 Iron deficiency anemia secondary to blood loss (chronic): Secondary | ICD-10-CM

## 2018-05-13 MED ORDER — SODIUM CHLORIDE 0.9 % IV SOLN
510.0000 mg | Freq: Once | INTRAVENOUS | Status: AC
  Administered 2018-05-13: 510 mg via INTRAVENOUS
  Filled 2018-05-13: qty 17

## 2018-05-13 MED ORDER — SODIUM CHLORIDE 0.9 % IV SOLN
Freq: Once | INTRAVENOUS | Status: AC
  Administered 2018-05-13: 15:00:00 via INTRAVENOUS
  Filled 2018-05-13: qty 250

## 2018-05-14 ENCOUNTER — Emergency Department: Payer: Self-pay

## 2018-05-14 ENCOUNTER — Other Ambulatory Visit: Payer: Self-pay

## 2018-05-14 ENCOUNTER — Observation Stay
Admission: EM | Admit: 2018-05-14 | Discharge: 2018-05-15 | Disposition: A | Discharge status: Home / Self Care | Payer: Self-pay | Attending: Obstetrics and Gynecology | Admitting: Obstetrics and Gynecology

## 2018-05-14 ENCOUNTER — Encounter: Payer: Self-pay | Admitting: Emergency Medicine

## 2018-05-14 DIAGNOSIS — R1031 Right lower quadrant pain: Secondary | ICD-10-CM | POA: Insufficient documentation

## 2018-05-14 DIAGNOSIS — D62 Acute posthemorrhagic anemia: Principal | ICD-10-CM | POA: Insufficient documentation

## 2018-05-14 DIAGNOSIS — N920 Excessive and frequent menstruation with regular cycle: Secondary | ICD-10-CM

## 2018-05-14 DIAGNOSIS — I10 Essential (primary) hypertension: Secondary | ICD-10-CM | POA: Insufficient documentation

## 2018-05-14 DIAGNOSIS — E876 Hypokalemia: Secondary | ICD-10-CM | POA: Diagnosis present

## 2018-05-14 DIAGNOSIS — Z79899 Other long term (current) drug therapy: Secondary | ICD-10-CM | POA: Insufficient documentation

## 2018-05-14 DIAGNOSIS — D472 Monoclonal gammopathy: Secondary | ICD-10-CM | POA: Insufficient documentation

## 2018-05-14 DIAGNOSIS — K449 Diaphragmatic hernia without obstruction or gangrene: Secondary | ICD-10-CM | POA: Insufficient documentation

## 2018-05-14 DIAGNOSIS — D649 Anemia, unspecified: Secondary | ICD-10-CM

## 2018-05-14 DIAGNOSIS — N921 Excessive and frequent menstruation with irregular cycle: Secondary | ICD-10-CM

## 2018-05-14 DIAGNOSIS — R109 Unspecified abdominal pain: Secondary | ICD-10-CM

## 2018-05-14 DIAGNOSIS — N938 Other specified abnormal uterine and vaginal bleeding: Secondary | ICD-10-CM | POA: Insufficient documentation

## 2018-05-14 DIAGNOSIS — Z833 Family history of diabetes mellitus: Secondary | ICD-10-CM | POA: Insufficient documentation

## 2018-05-14 DIAGNOSIS — D6489 Other specified anemias: Secondary | ICD-10-CM

## 2018-05-14 DIAGNOSIS — N83201 Unspecified ovarian cyst, right side: Secondary | ICD-10-CM | POA: Insufficient documentation

## 2018-05-14 HISTORY — DX: Excessive and frequent menstruation with irregular cycle: N92.1

## 2018-05-14 HISTORY — DX: Acute posthemorrhagic anemia: D62

## 2018-05-14 LAB — URINALYSIS, COMPLETE (UACMP) WITH MICROSCOPIC
Bilirubin Urine: NEGATIVE
GLUCOSE, UA: NEGATIVE mg/dL
KETONES UR: NEGATIVE mg/dL
LEUKOCYTES UA: NEGATIVE
NITRITE: NEGATIVE
PH: 5 (ref 5.0–8.0)
Protein, ur: 100 mg/dL — AB
RBC / HPF: >50 RBC/hpf — ABNORMAL HIGH (ref 0–5)
Specific Gravity, Urine: 1.009 (ref 1.005–1.030)
Squamous Epithelial / LPF: NONE SEEN (ref 0–5)

## 2018-05-14 LAB — PREPARE RBC (CROSSMATCH)

## 2018-05-14 LAB — PROTIME-INR
INR: 0.96
PROTHROMBIN TIME: 12.7 s (ref 11.4–15.2)

## 2018-05-14 LAB — BASIC METABOLIC PANEL
ANION GAP: 7 (ref 5–15)
BUN: 11 mg/dL (ref 6–20)
CALCIUM: 8.8 mg/dL — AB (ref 8.9–10.3)
CO2: 27 mmol/L (ref 22–32)
Chloride: 104 mmol/L (ref 98–111)
Creatinine, Ser: 0.93 mg/dL (ref 0.44–1.00)
Glucose, Bld: 131 mg/dL — ABNORMAL HIGH (ref 70–99)
Potassium: 3 mmol/L — ABNORMAL LOW (ref 3.5–5.1)
Sodium: 138 mmol/L (ref 135–145)

## 2018-05-14 LAB — CBC
HCT: 21 % — ABNORMAL LOW (ref 36.0–46.0)
HEMOGLOBIN: 6 g/dL — AB (ref 12.0–15.0)
MCH: 24.4 pg — AB (ref 26.0–34.0)
MCHC: 28.6 g/dL — ABNORMAL LOW (ref 30.0–36.0)
MCV: 85.4 fL (ref 80.0–100.0)
Platelets: 497 10*3/uL — ABNORMAL HIGH (ref 150–400)
RBC: 2.46 MIL/uL — AB (ref 3.87–5.11)
RDW: 22.7 % — ABNORMAL HIGH (ref 11.5–15.5)
WBC: 10.3 10*3/uL (ref 4.0–10.5)
nRBC: 0.9 % — ABNORMAL HIGH (ref 0.0–0.2)

## 2018-05-14 LAB — HCG, QUANTITATIVE, PREGNANCY

## 2018-05-14 MED ORDER — POTASSIUM CHLORIDE CRYS ER 20 MEQ PO TBCR
20.0000 meq | EXTENDED_RELEASE_TABLET | Freq: Three times a day (TID) | ORAL | Status: AC
  Administered 2018-05-14 – 2018-05-15 (×3): 20 meq via ORAL
  Filled 2018-05-14 (×3): qty 1

## 2018-05-14 MED ORDER — FENTANYL CITRATE (PF) 100 MCG/2ML IJ SOLN
50.0000 ug | Freq: Once | INTRAMUSCULAR | Status: AC
  Administered 2018-05-14: 50 ug via INTRAVENOUS
  Filled 2018-05-14: qty 2

## 2018-05-14 MED ORDER — MORPHINE SULFATE (PF) 4 MG/ML IV SOLN
4.0000 mg | Freq: Once | INTRAVENOUS | Status: AC
  Administered 2018-05-14: 4 mg via INTRAVENOUS
  Filled 2018-05-14: qty 1

## 2018-05-14 MED ORDER — FENTANYL CITRATE (PF) 100 MCG/2ML IJ SOLN
50.0000 ug | INTRAMUSCULAR | Status: DC | PRN
  Administered 2018-05-14: 50 ug via INTRAVENOUS
  Filled 2018-05-14: qty 2

## 2018-05-14 MED ORDER — MEDROXYPROGESTERONE ACETATE 10 MG PO TABS
20.0000 mg | ORAL_TABLET | Freq: Three times a day (TID) | ORAL | Status: DC
  Administered 2018-05-14 – 2018-05-15 (×3): 20 mg via ORAL
  Filled 2018-05-14 (×3): qty 2

## 2018-05-14 MED ORDER — MORPHINE SULFATE (PF) 4 MG/ML IV SOLN
4.0000 mg | INTRAVENOUS | Status: DC | PRN
  Filled 2018-05-14: qty 1

## 2018-05-14 MED ORDER — SODIUM CHLORIDE 0.9% IV SOLUTION
Freq: Once | INTRAVENOUS | Status: AC
  Administered 2018-05-14: 23:00:00 via INTRAVENOUS
  Filled 2018-05-14: qty 250

## 2018-05-14 MED ORDER — SODIUM CHLORIDE 0.9 % IV BOLUS
1000.0000 mL | Freq: Once | INTRAVENOUS | Status: AC
  Administered 2018-05-14: 1000 mL via INTRAVENOUS

## 2018-05-14 MED ORDER — LACTATED RINGERS IV SOLN
125.0000 mL/h | INTRAVENOUS | Status: DC
  Administered 2018-05-15 (×3): 125 mL/h via INTRAVENOUS

## 2018-05-14 MED ORDER — ONDANSETRON HCL 4 MG/2ML IJ SOLN
4.0000 mg | Freq: Once | INTRAMUSCULAR | Status: AC
  Administered 2018-05-14: 4 mg via INTRAVENOUS
  Filled 2018-05-14: qty 2

## 2018-05-14 NOTE — ED Triage Notes (Signed)
Patient presents with right flank pain and right lower abdominal pain that began around 11:30am today.  Patient is tearful in triage.  Patient states, "I thought it was a cramp but then it didn't go away and I started throwing up."  Patient reports vomiting x 1 today.

## 2018-05-14 NOTE — ED Notes (Signed)
Patient transported to Ultrasound 

## 2018-05-14 NOTE — ED Provider Notes (Addendum)
[facility] Emergency Department Provider Note  ____________________________________________   I have reviewed the triage vital signs and the nursing notes. Where available I have reviewed prior notes and, if possible and indicated, outside hospital notes.    HISTORY  Chief Complaint Flank Pain    HPI Barbara Juarez is a 47 y.o. female with a history of MGUS, anemia from heavy vaginal bleeding gets iron transfusion, had an iron transfusion yesterday for symptomatic anemia, presents today with right-sided abdominal pain radiating to the right flank.  No dysuria no urinary frequency, no vaginal discharge.  Is on her period at this time, normal.  Which is quite heavy for her.  She states that she has had no fever no chills, she states she has had nausea and vomited x1 "from the pain".  Is a sharp pain radiates to her flank.  No other alleviating or aggravating symptoms.    Past Medical History:  Diagnosis Date  . Anemia   . Hypertension     Patient Active Problem List   Diagnosis Date Noted  . Monoclonal gammopathy of unknown significance (MGUS) 05/27/2016  . Iron deficiency anemia due to chronic blood loss 02/08/2015    Past Surgical History:  Procedure Laterality Date  . CESAREAN SECTION  1999  . CESAREAN SECTION  07/1991    Prior to Admission medications   Medication Sig Start Date End Date Taking? Authorizing Provider  ferrous sulfate 325 (65 FE) MG EC tablet Take 325 mg by mouth 3 (three) times daily with meals.    [provider]  hydrochlorothiazide (HYDRODIURIL) 50 MG tablet Take 50 mg by mouth daily.    [provider]    Allergies Patient has no known allergies.  Family History  Problem Relation Age of Onset  . Diabetes Mother   . Breast cancer Neg Hx     Social History Social History   Tobacco Use  . Smoking status: Never Smoker  . Smokeless tobacco: Never Used  Substance Use Topics  . Alcohol use: Yes     Alcohol/week: 1.0 standard drinks    Types: 1 Glasses of wine per week    Comment: once a month  . Drug use: No    Review of Systems Constitutional: No fever/chills Eyes: No visual changes. ENT: No sore throat. No stiff neck no neck pain Cardiovascular: Denies chest pain. Respiratory: Denies shortness of breath. Gastrointestinal:   + vomiting.  No diarrhea.  No constipation. Genitourinary: Negative for dysuria. Musculoskeletal: Negative lower extremity swelling Skin: Negative for rash. Neurological: Negative for severe headaches, focal weakness or numbness.   ____________________________________________   PHYSICAL EXAM:  VITAL SIGNS: ED Triage Vitals  Enc Vitals Group     BP 05/14/18 1311 (!) 163/99     Pulse Rate 05/14/18 1311 (!) 121     Resp 05/14/18 1311 18     Temp 05/14/18 1311 97.8 F (36.6 C)     Temp Source 05/14/18 1311 Oral     SpO2 05/14/18 1311 100 %     Weight 05/14/18 1310 270 lb (122.5 kg)     Height 05/14/18 1310 5\' 4"  (1.626 m)     Head Circumference --      Peak Flow --      Pain Score 05/14/18 1314 10     Pain Loc --      Pain Edu? --      Excl. in [place]? --     Constitutional: Alert and oriented. Well appearing and  in no acute distress. Eyes: Conjunctivae are normal Head: Atraumatic HEENT: No congestion/rhinnorhea. Mucous membranes are moist.  Oropharynx non-erythematous Neck:   Nontender with no meningismus, no masses, no stridor Cardiovascular: Normal rate, regular rhythm. Grossly normal heart sounds.  Good peripheral circulation. Respiratory: Normal respiratory effort.  No retractions. Lungs CTAB. Abdominal: Soft and obesity, mild right. No distention. No guarding no rebound Back:  There is no focal tenderness or step off.  there is no midline tenderness there are no lesions noted. there is right CVA tenderness Musculoskeletal: No lower extremity tenderness, no upper extremity tenderness. No joint effusions, no DVT signs strong distal pulses  no edema Neurologic:  Normal speech and language. No gross focal neurologic deficits are appreciated.  Skin:  Skin is warm, dry and intact. No rash noted. Psychiatric: Mood and affect are normal. Speech and behavior are normal.  ____________________________________________   LABS (all labs ordered are listed, but only abnormal results are displayed)  Labs Reviewed  BASIC METABOLIC PANEL - Abnormal; Notable for the following components:      Result Value   Potassium 3.0 (*)    Glucose, Bld 131 (*)    Calcium 8.8 (*)    All other components within normal limits  CBC - Abnormal; Notable for the following components:   RBC 2.46 (*)    Hemoglobin 6.0 (*)    HCT 21.0 (*)    MCH 24.4 (*)    MCHC 28.6 (*)    RDW 22.7 (*)    Platelets 497 (*)    nRBC 0.9 (*)    All other components within normal limits  HCG, QUANTITATIVE, PREGNANCY  URINALYSIS, COMPLETE (UACMP) WITH MICROSCOPIC  PROTIME-INR  POC URINE PREG, ED  TYPE AND SCREEN    Pertinent labs  results that were available during my care of the patient were reviewed by me and considered in my medical decision making (see chart for details). ____________________________________________  EKG  I personally interpreted any EKGs ordered by me or triage  ____________________________________________  RADIOLOGY  Pertinent labs & imaging results that were available during my care of the patient were reviewed by me and considered in my medical decision making (see chart for details). If possible, patient and/or family made aware of any abnormal findings.  Ct Renal Stone Study  Result Date: 05/14/2018 CLINICAL DATA:  Right flank and lower abdominal pain starting at 11:30 a.m. EXAM: CT ABDOMEN AND PELVIS WITHOUT CONTRAST TECHNIQUE: Multidetector CT imaging of the abdomen and pelvis was performed following the standard protocol without IV contrast. COMPARISON:  None. Prior ultrasound exams of the pelvis were pregnancy related from 1998.  FINDINGS: Lower chest: Normal heart size without pericardial effusion. Small hiatal hernia. Clear lung bases. Hepatobiliary: No focal liver abnormality is seen. No gallstones, gallbladder wall thickening, or biliary dilatation. Pancreas: Unremarkable. No pancreatic ductal dilatation or surrounding inflammatory changes. Spleen: Normal in size without focal abnormality. Adrenals/Urinary Tract: Normal bilateral adrenal glands. No nephrolithiasis nor hydroureteronephrosis. The urinary bladder is decompressed with extrinsic mass effect from the adjacent uterus. Stomach/Bowel: Stomach is within normal limits. Appendix appears normal. No evidence of bowel wall thickening, distention, or inflammatory changes. Vascular/Lymphatic: No significant vascular findings are present. No enlarged abdominal or pelvic lymph nodes. Reproductive: Bulky appearing uterus measuring at least 17 x 12 x 12.1 cm in craniocaudad by AP by transverse dimension with areas of hypodensity noted within suspicious for areas of degeneration. This can be further correlated with MRI. A simple appearing cyst in the right adnexa measuring  3.6 x 3.7 x 3.4 cm is identified. The left adnexa is unremarkable. Within the lower uterine segment, there is a small amount of fluid outlining a 1.5 x 1.4 x 3 cm soft tissue density possibly a submucosal fibroid, polyp or blood products, series 2/71. Other: Slight rectus diastasis. No free air nor free fluid. Musculoskeletal: No acute or significant osseous findings. IMPRESSION: 1. Bulky appearing uterus measuring at least 17 x 12 x 12.1 cm in craniocaudad by AP by transverse dimension with areas of hypodensity noted within suspicious for areas of degeneration. Findings may represent stigmata of diffuse uterine fibroid formation. This can be further correlated with pelvic MRI. 2. Simple appearing right adnexal cyst measuring 3.6 x 3.7 x 3.4 cm. 3. Small amount of fluid outlining a 1.5 x 1.4 x 3 cm soft tissue density in  the lower uterine segment possibly a submucosal fibroid, polyp or blood products. Direct visual correlation may prove useful in further assessment. Electronically Signed   By: Ashley Royalty M.D.   On: 05/14/2018 15:10   ____________________________________________    PROCEDURES  Procedure(s) performed: None  Procedures  Critical Care performed: None  ____________________________________________   INITIAL IMPRESSION / ASSESSMENT AND PLAN / ED COURSE  Pertinent labs & imaging results that were available during my care of the patient were reviewed by me and considered in my medical decision making (see chart for details).  Patient with a few different issues today.  Her hemoglobin is quite low this is likely secondary to significant vaginal bleeding, she is used multiple tampons today.  She does have a history of chronic anemia secondary to heavy vaginal bleeding and does receive iron transfusions however, it is of concern that she is got down this low.  Secondarily, patient has pain, CT scan shows a markedly enlarged uterus consistent with fibroid disease which is likely why she has been having some vaginal bleeding.  As well as an ovarian cyst.  We will send her for evaluation for torsion although low suspicion given size of the cyst.  Patient does have a BTL and she is past reproductive age.  She would prefer not have a pelvic exam at this time.  The patient's signed out at this time to Dr. Clearnce Hasten for further evaluation and disposition, sound pending.  Did discuss with Dr. Glennon Mac, OB/GYN who agrees with management and will admit and I very much appreciate consult.    ____________________________________________   FINAL CLINICAL IMPRESSION(S) / ED DIAGNOSES  Final diagnoses:  Abdominal pain      This chart was dictated using voice recognition software.  Despite best efforts to proofread,  errors can occur which can change meaning.      Schuyler Amor, MD 05/14/18 [id number]     Schuyler Amor, MD 05/14/18 [phone]

## 2018-05-14 NOTE — H&P (Signed)
GYNECOLOGY ADMISSION HISTORY AND PHYSICAL NOTE    Attending Provider: Charlotte Crumb, MD  SHAQUINA GILLHAM [phone] 05/14/2018 9:16 PM    Chief Complaint:   Barbara Juarez is a 47 y.o. [id number] premenopausal female seen at the request of Dr. Burlene Arnt for evaluation of right lower quadrant abdominal pain, menorrhagia with irregular cycle (abnormal uterine bleeding), acute on chronic anemia due to acute blood loss.    History of Present Ilness:   She presents mainly due to an acute-onset right lower quadrant and right flank pain that began around 1100 today.  The pain continued to get worse. So, the patient presented to the ER.  She patient rates the pain as severe. The pain does not really radiate. The pain comes and goes.  She can think of nothing that makes the pain worse.  Lying down and "balling up" seem to make the pain a little better.  She had one episode during a, particularly painful time, of nausea and emesis. She denies any associated symptoms at this time.  Specifically, she denies fevers, chills, chest pain, shortness of breath, diarrhea, constipation, hematochezia, hematuria.  She denies other urinary symptoms.  She does feel more fatigued than normal for her.  Notably, she has a history of iron deficiency anemia for which she has received at least a couple of iron infusions.  The most recent iron infusion was yesterday and on 05/05/18.  She does not have gynecologist.   She states that her menses in the past have occurred for the most part monthly and regularly and last about 7 days. Since the end of September she has had heavier menses.  She is also currently on her menses, which began on 11/1, which is prolonged for her. She states that she has been passing some clots. She has to change a tampon every 45 minutes. She will have bouts of heavier bleeding and periods of much lighter bleeding.  She has never had a work up for this. She states her last pap smear was 4 years ago and was  normal. She states that she has never had an abnormal pap smear.  She denies a history of STDs.  She states that she had a tubal ligation.    Past Medical History:  Diagnosis Date  . Anemia   . Hypertension    Past Surgical History:  Procedure Laterality Date  . CESAREAN SECTION  1999  . CESAREAN SECTION  07/1991   Allergies: No Known Allergies   Prior to Admission medications   Medication Sig Start Date End Date Taking? Authorizing Provider  esomeprazole (NEXIUM) 20 MG capsule Take 20 mg by mouth every other day.   Yes [provider]  ferrous sulfate 325 (65 FE) MG EC tablet Take 325 mg by mouth 3 (three) times daily with meals.   Yes [provider]  hydrochlorothiazide (HYDRODIURIL) 50 MG tablet Take 50 mg by mouth daily.   Yes [provider]    Obstetric History: She is a [id number] female s/p c-section x 2.     Social History:  She  reports that she has never smoked. She has never used smokeless tobacco. She reports that she drinks about 1.0 standard drinks of alcohol per week. She reports that she does not use drugs.  Family History:  family history includes Diabetes in her mother.   Review of Systems:   Review of Systems  Constitutional: Positive for malaise/fatigue. Negative for chills and fever.  HENT: Negative.   Eyes: Negative.  Respiratory: Negative.   Cardiovascular: Negative.  Negative for chest pain and palpitations.  Gastrointestinal: Positive for abdominal pain (see HPI), nausea and vomiting. Negative for blood in stool, constipation, diarrhea and melena.  Genitourinary: Positive for flank pain (right). Negative for dysuria, frequency, hematuria and urgency.  Skin: Negative.   Neurological: Negative.   Psychiatric/Behavioral: Negative.       Objective    BP (!) 145/78 (BP Location: Right Arm)   Pulse 93   Temp 97.8 F (36.6 C) (Oral)   Resp 16   Ht 5\' 4"  (1.626 m)   Wt 122.5 kg   LMP 05/06/2018 (Approximate) Comment: neg  hcg  SpO2 100%   BMI 46.35 kg/m  Physical Exam  Physical Exam  Constitutional: She is oriented to person, place, and time. She appears well-developed and well-nourished. No distress.  Morbidly obese  Genitourinary: Vagina normal. Pelvic exam was performed with patient supine. There is no rash, tenderness or lesion on the right labia. There is no rash, tenderness or lesion on the left labia.  Cervix exhibits polyp (possible small polyp visible at external os, not amenable to removal). Cervix does not exhibit motion tenderness or discharge.   Uterus is enlarged.  Genitourinary Comments: Uterus palpated to level above umbilicus, moderately tender to palpation.  Bimanual not performed due to tenderness in RLQ and enlarged uterus and likely lack of information it would have provided given size of uterus  HENT:  Head: Normocephalic and atraumatic.  Eyes: Conjunctivae are normal. No scleral icterus.  Cardiovascular: Regular rhythm. Exam reveals no friction rub.  Murmur (II/VI SEM) heard. Mildly tachycardic  Pulmonary/Chest: Effort normal and breath sounds normal. No respiratory distress. She has no wheezes. She has no rales.  Abdominal: Soft. Bowel sounds are normal. She exhibits mass (uterus palpates to level above umbilicus). She exhibits no distension. There is tenderness (right lower quadrant). There is guarding. There is no rebound (unable to assess due to guarding).  Musculoskeletal: Normal range of motion. She exhibits no edema.  Neurological: She is alert and oriented to person, place, and time. No cranial nerve deficit.  Skin: Skin is warm and dry. No erythema.  Psychiatric: She has a normal mood and affect. Her behavior is normal. Judgment normal.   Laboratory Results:   Lab Results  Component Value Date   WBC 10.3 05/14/2018   RBC 2.46 (L) 05/14/2018   HGB 6.0 (L) 05/14/2018   HCT 21.0 (L) 05/14/2018   PLT 497 (H) 05/14/2018   NA 138 05/14/2018   K 3.0 (L) 05/14/2018    CREATININE 0.93 05/14/2018   Imaging Results:  Ct Renal Stone Study  Result Date: 05/14/2018 CLINICAL DATA:  Right flank and lower abdominal pain starting at 11:30 a.m. EXAM: CT ABDOMEN AND PELVIS WITHOUT CONTRAST TECHNIQUE: Multidetector CT imaging of the abdomen and pelvis was performed following the standard protocol without IV contrast. COMPARISON:  None. Prior ultrasound exams of the pelvis were pregnancy related from 1998. FINDINGS: Lower chest: Normal heart size without pericardial effusion. Small hiatal hernia. Clear lung bases. Hepatobiliary: No focal liver abnormality is seen. No gallstones, gallbladder wall thickening, or biliary dilatation. Pancreas: Unremarkable. No pancreatic ductal dilatation or surrounding inflammatory changes. Spleen: Normal in size without focal abnormality. Adrenals/Urinary Tract: Normal bilateral adrenal glands. No nephrolithiasis nor hydroureteronephrosis. The urinary bladder is decompressed with extrinsic mass effect from the adjacent uterus. Stomach/Bowel: Stomach is within normal limits. Appendix appears normal. No evidence of bowel wall thickening, distention, or inflammatory changes. Vascular/Lymphatic: No significant vascular  findings are present. No enlarged abdominal or pelvic lymph nodes. Reproductive: Bulky appearing uterus measuring at least 17 x 12 x 12.1 cm in craniocaudad by AP by transverse dimension with areas of hypodensity noted within suspicious for areas of degeneration. This can be further correlated with MRI. A simple appearing cyst in the right adnexa measuring 3.6 x 3.7 x 3.4 cm is identified. The left adnexa is unremarkable. Within the lower uterine segment, there is a small amount of fluid outlining a 1.5 x 1.4 x 3 cm soft tissue density possibly a submucosal fibroid, polyp or blood products, series 2/71. Other: Slight rectus diastasis. No free air nor free fluid. Musculoskeletal: No acute or significant osseous findings. IMPRESSION: 1. Bulky  appearing uterus measuring at least 17 x 12 x 12.1 cm in craniocaudad by AP by transverse dimension with areas of hypodensity noted within suspicious for areas of degeneration. Findings may represent stigmata of diffuse uterine fibroid formation. This can be further correlated with pelvic MRI. 2. Simple appearing right adnexal cyst measuring 3.6 x 3.7 x 3.4 cm. 3. Small amount of fluid outlining a 1.5 x 1.4 x 3 cm soft tissue density in the lower uterine segment possibly a submucosal fibroid, polyp or blood products. Direct visual correlation may prove useful in further assessment. Electronically Signed   By: Ashley Royalty M.D.   On: 05/14/2018 15:10   US Pelvic Complete W Transvaginal And Torsion R/o  Result Date: 05/14/2018 CLINICAL DATA:  47 y/o  F; lower abdominal pain. EXAM: TRANSABDOMINAL AND TRANSVAGINAL ULTRASOUND OF PELVIS TECHNIQUE: Both transabdominal and transvaginal ultrasound examinations of the pelvis were performed. Transabdominal technique was performed for global imaging of the pelvis including uterus, ovaries, adnexal regions, and pelvic cul-de-sac. It was necessary to proceed with endovaginal exam following the transabdominal exam to visualize the endometrium. COMPARISON:  None FINDINGS: Uterus Measurements: 18.5 x 10.9 x 14.2 cm = volume: 1,499 mL. Anterior mid myometrial fibroid measuring 7.3 x 8.4 x 9.3 cm. Fundal fibroid measuring 4.6 x 4.7 x 4.3 cm. Endometrium Thickness: 28.3 mm. Incompletely visualized due to large anterior mid myometrial fibroid. Right ovary Measurements: 4.2 x 2.9 x 3.8 cm = volume: 24.1 mL. 3.1 cm simple cyst. Left ovary Measurements: 5.0 x 3.3 x 2.8 cm = volume: 23.9 mL. Normal appearance/no adnexal mass. Other findings No abnormal free fluid. IMPRESSION: 1. Anterior mid myometrial fibroid measuring up to 9.3 cm. Fundal fibroid measuring up to 4.7 cm. 2. Endometrium is incompletely visualized due to the large mid myometrial fibroid. The endometrium measures up to 28  mm at the fundus. Endometrial thickness is considered abnormal. Consider follow-up by [place] in 6-8 weeks, during the week immediately following menses (exam timing is critical). 3. No acute process identified. Electronically Signed   By: Kristine Garbe M.D.   On: 05/14/2018 17:42      Assessment & Plan   ERNA BROSSARD is a 47 y.o. [phone] premenopausal female with right lower quadrant pain, severe anemia due to acute blood loss with a history of chronic anemia due to iron deficiency being seen in consultation.    Plan:  1.  Severe anemia: Plan for transfusion of 2 units pRBCs.  Patient personally verbally consented by me.   2.  Right lower quadrant abdominal pain: No clear source. She does have a 3.1 cm simple-appearing right ovarian cyst.  Her appendix appears normal.  Her CT with stone protocol did not show nephroureterolithiasis.  UA not suggestive of UTI.  Also, pyelonephritis very low  and differential.  CT showed normal-appearing bowels.  Her abdominal exam is somewhat concerning, though soft. Will continue to monitor. Discussed possible ovarian torsion and possible need for laparoscopy to further evaluate.  However, her cyst is small. But, can not definitively rule this out. She has responded well to medium morphine dose. If pain worsens, will have to take to OR.    3.  Menorrhagia with irregular cycle: Will give provera 20 mg PO TID to affect cessation of blood loss. May consider adding TXA.  Discussed findings on CT of enlarged uterus and lower uterine segment/cervical lesion that is possibly a polyp based on findings on physical exam.  If she responds well to treatment, will have her follow up very soon in office to discuss definitive management.  This will likely save her from the need for further iron infusions and blood transfusions.   4. Hypokalemia.  Will replete once she is no longer NPO or PRN.    Prentice Docker, MD 05/14/2018 9:16 PM

## 2018-05-15 DIAGNOSIS — D6489 Other specified anemias: Secondary | ICD-10-CM

## 2018-05-15 DIAGNOSIS — N920 Excessive and frequent menstruation with regular cycle: Secondary | ICD-10-CM

## 2018-05-15 DIAGNOSIS — E876 Hypokalemia: Secondary | ICD-10-CM

## 2018-05-15 DIAGNOSIS — R1031 Right lower quadrant pain: Secondary | ICD-10-CM

## 2018-05-15 LAB — BASIC METABOLIC PANEL
Anion gap: 7 (ref 5–15)
BUN: 9 mg/dL (ref 6–20)
CHLORIDE: 104 mmol/L (ref 98–111)
CO2: 27 mmol/L (ref 22–32)
Calcium: 8.4 mg/dL — ABNORMAL LOW (ref 8.9–10.3)
Creatinine, Ser: 0.74 mg/dL (ref 0.44–1.00)
GFR calc Af Amer: >60 mL/min (ref >60)
GFR calc non Af Amer: >60 mL/min (ref >60)
Glucose, Bld: 109 mg/dL — ABNORMAL HIGH (ref 70–99)
POTASSIUM: 3 mmol/L — AB (ref 3.5–5.1)
Sodium: 138 mmol/L (ref 135–145)

## 2018-05-15 LAB — HEMOGLOBIN AND HEMATOCRIT, BLOOD
HCT: 21.9 % — ABNORMAL LOW (ref 36.0–46.0)
HCT: 26.2 % — ABNORMAL LOW (ref 36.0–46.0)
Hemoglobin: 6.6 g/dL — ABNORMAL LOW (ref 12.0–15.0)
Hemoglobin: 8 g/dL — ABNORMAL LOW (ref 12.0–15.0)

## 2018-05-15 LAB — PREPARE RBC (CROSSMATCH)

## 2018-05-15 LAB — ABO/RH: ABO/RH(D): O POS

## 2018-05-15 MED ORDER — IBUPROFEN 600 MG PO TABS
600.0000 mg | ORAL_TABLET | Freq: Four times a day (QID) | ORAL | Status: DC | PRN
  Administered 2018-05-15 (×3): 600 mg via ORAL
  Filled 2018-05-15 (×3): qty 1

## 2018-05-15 MED ORDER — HYDROCHLOROTHIAZIDE 25 MG PO TABS
50.0000 mg | ORAL_TABLET | Freq: Every day | ORAL | Status: DC
  Administered 2018-05-15: 50 mg via ORAL
  Filled 2018-05-15: qty 2

## 2018-05-15 MED ORDER — ACETAMINOPHEN 325 MG PO TABS
650.0000 mg | ORAL_TABLET | Freq: Once | ORAL | Status: AC
  Administered 2018-05-15: 650 mg via ORAL
  Filled 2018-05-15: qty 2

## 2018-05-15 MED ORDER — FERROUS SULFATE 325 (65 FE) MG PO TABS
325.0000 mg | ORAL_TABLET | Freq: Three times a day (TID) | ORAL | Status: DC
  Administered 2018-05-15 (×2): 325 mg via ORAL
  Filled 2018-05-15 (×2): qty 1

## 2018-05-15 MED ORDER — MEDROXYPROGESTERONE ACETATE 10 MG PO TABS: 20.0000 mg | ORAL_TABLET | Freq: Three times a day (TID) | ORAL | 0 refills | Status: DC

## 2018-05-15 MED ORDER — POTASSIUM CHLORIDE 10 MEQ/100ML IV SOLN
10.0000 meq | INTRAVENOUS | Status: AC
  Administered 2018-05-15 (×2): 10 meq via INTRAVENOUS
  Filled 2018-05-15 (×2): qty 100

## 2018-05-15 MED ORDER — HYDROCODONE-ACETAMINOPHEN 5-325 MG PO TABS
1.0000 | ORAL_TABLET | ORAL | Status: DC | PRN
  Administered 2018-05-15 (×2): 1 via ORAL
  Filled 2018-05-15 (×2): qty 1

## 2018-05-15 MED ORDER — SODIUM CHLORIDE 0.9% IV SOLUTION
Freq: Once | INTRAVENOUS | Status: AC
  Administered 2018-05-15: 15:00:00 via INTRAVENOUS

## 2018-05-15 MED ORDER — IBUPROFEN 600 MG PO TABS: 600.0000 mg | ORAL_TABLET | Freq: Four times a day (QID) | ORAL | 0 refills | Status: DC | PRN

## 2018-05-15 MED ORDER — DIPHENHYDRAMINE HCL 25 MG PO CAPS
25.0000 mg | ORAL_CAPSULE | Freq: Once | ORAL | Status: AC
  Administered 2018-05-15: 25 mg via ORAL
  Filled 2018-05-15: qty 1

## 2018-05-15 MED ORDER — HYDROCODONE-ACETAMINOPHEN 5-325 MG PO TABS: 1.0000 | ORAL_TABLET | ORAL | 0 refills | Status: DC | PRN

## 2018-05-15 NOTE — Progress Notes (Signed)
Dr. Kenton Kingfisher acknowledged patient's labs, to come see patient after he gets out of surgery. Patient updated.

## 2018-05-15 NOTE — Progress Notes (Signed)
Benign Gynecology Progress Note  Admission Date: 05/14/2018 Current Date: 05/15/2018  Barbara Juarez is a 47 y.o. No obstetric history on file. HD#1.  History complicated by: Patient Active Problem List   Diagnosis Date Noted  . Anemia associated with acute blood loss 05/14/2018  . Hypokalemia 05/14/2018  . Right lower quadrant abdominal pain 05/14/2018  . Menorrhagia with irregular cycle 05/14/2018  . Monoclonal gammopathy of unknown significance (MGUS) 05/27/2016  . Iron deficiency anemia due to chronic blood loss 02/08/2015   ROS and patient/family/surgical history, located on admission H&P note dated 05/14/2018, have been reviewed, and there are no changes except as noted below  Yesterday/Overnight Events:  Blood transfusion Subjective:  Pt seen and examined.  Has received 2 U PRBC and potassium.  Less pain.  Less bleeding. Pt has had menorrhagia and has fibroids on [place].  Objective:   Vitals:   05/15/18 0216 05/15/18 0455 05/15/18 0840 05/15/18 1221  BP: (!) 122/59 121/60 138/73 134/75  Pulse: (!) 105 100 (!) 105 88  Resp: 18 20 20 18   Temp: 98.2 F (36.8 C) 98.2 F (36.8 C) 98 F (36.7 C) 98.3 F (36.8 C)  TempSrc: Oral Oral Oral Oral  SpO2: 100%  100% 100%  Weight:      Height:       Temp:  [97.9 F (36.6 C)-99 F (37.2 C)] 98.3 F (36.8 C) (11/15 1221) Pulse Rate:  [85-115] 88 (11/15 1221) Resp:  [16-20] 18 (11/15 1221) BP: (118-171)/(51-142) 134/75 (11/15 1221) SpO2:  [96 %-100 %] 100 % (11/15 1221) I/O last 3 completed shifts: In: 1580 [Blood:580; IV Piggyback:1000] Out: 100 [Urine:100] Total I/O In: 240 [P.O.:240] Out: 550 [Urine:550]  Intake/Output Summary (Last 24 hours) at 05/15/2018 1402 Last data filed at 05/15/2018 1200 Gross per 24 hour  Intake 1820 ml  Output 650 ml  Net 1170 ml     Current Vital Signs 24h Vital Sign Ranges  T 98.3 F (36.8 C) Temp  Avg: 98.3 F (36.8 C)  Min: 97.9 F (36.6 C)  Max: 99 F (37.2 C)  BP 134/75 BP   Min: 118/64  Max: 171/142  HR 88 Pulse  Avg: 99.6  Min: 85  Max: 115  RR 18 Resp  Avg: 17.9  Min: 16  Max: 20  SaO2 100 % Room Air SpO2  Avg: 99 %  Min: 96 %  Max: 100 %           24 Hour I/O Current Shift I/O  Time Ins Outs 11/14 0701 - 11/15 0700 In: 1580  Out: 100 [Urine:100] 11/15 0701 - 11/15 1900 In: 240 [P.O.:240] Out: 550 [Urine:550]    Physical exam: General appearance: alert, cooperative and appears stated age Abdomen: soft, non-tender; bowel sounds normal; no masses, no organomegaly GU: No gross VB Lungs: clear to auscultation bilaterally Heart: regular rate and rhythm and no MRGs Extremities: no redness or tenderness in the calves or thighs, no edema Skin: no lesions Psych: appropriate  Recent Labs  Lab 05/14/18 1322 05/15/18 1053  NA 138 138  K 3.0* 3.0*  CL 104 104  CO2 27 27  BUN 11 9  CREATININE 0.93 0.74  GLUCOSE 131* 109*   Recent Labs  Lab 05/14/18 1322 05/15/18 1053  WBC 10.3  --   HGB 6.0* 6.6*  HCT 21.0* 21.9*  PLT 497*  --     Recent Labs  Lab 05/14/18 1322 05/15/18 1053  CALCIUM 8.8* 8.4*   Recent Labs  Lab 05/14/18  1702  INR 0.96      No results for input(s): ALKPHOS, BILITOT, BILIDIR, PROT, ALT, AST in the last 168 hours.  Invalid input(s): LABALBU  Recent Labs  Lab 05/14/18 1322 05/15/18 1053  WBC 10.3  --   HGB 6.0* 6.6*  HCT 21.0* 21.9*  PLT 497*  --    Recent Labs  Lab 05/14/18 1322 05/15/18 1053  NA 138 138  K 3.0* 3.0*  CL 104 104  CO2 27 27  BUN 11 9  CREATININE 0.93 0.74  CALCIUM 8.8* 8.4*  GLUCOSE 131* 109*   Assessment & Plan:  Menorrhagia, Fibroids, DUB, anemia\  Transfuse one more unit and recheck Potassium IV Diet  Patient has abnormal uterine bleeding .   Evaluation includes the following: exam, labs such as hormonal testing, and pelvic ultrasound to evaluate for any structural gynecologic abnormalities.   Treatment option for menorrhagia or menometrorrhagia discussed in great detail  with the patient.  Options include hormonal therapy, IUD therapy such as Mirena, D&C, Ablation, and Hysterectomy.  The pros and cons of each option discussed with patient.  Barnett Applebaum, MD, [facility], [facility] 05/15/2018  2:04 PM

## 2018-05-15 NOTE — Progress Notes (Signed)
Pt requesting to take home medications this morning. Dr. Kenton Kingfisher now on call, paged.

## 2018-05-15 NOTE — Plan of Care (Signed)

## 2018-05-15 NOTE — Progress Notes (Addendum)
Hgb 6.6 six hours after 2nd unit pRBC transfusion.  Continued small/moderate bleeding, Pt otherwise asymptomatic. Potassium 3.0 despite 20 mEq oral replacement x2. Dr. Kenton Kingfisher paged to be updated.

## 2018-05-15 NOTE — Discharge Summary (Signed)
Physician Discharge Summary  Patient ID: Barbara Juarez MRN: [id number] DOB/AGE: [birth year] 45 y.o.  Admit date: 05/14/2018 Discharge date: 05/15/2018  Admission Diagnoses:Menorrhagia, Anemia, Hypokalemia, right lower quadrant pain  Discharge Diagnoses:  Principal Problem:   Anemia associated with acute blood loss Active Problems:   Hypokalemia   Right lower quadrant abdominal pain   Menorrhagia with irregular cycle   Discharged Condition: good  Hospital Course: Pt admitted for pain and bleeding.  Transfusion x 3 U.  Provera for hormonal control of bleeding.  Potassium given.  See notes.  Consults: None  Significant Diagnostic Studies: CT, labs  Treatments: IV hydration and transfusion  Discharge Exam: Blood pressure 138/77, pulse 93, temperature 98.3 F (36.8 C), temperature source Oral, resp. rate 18, height 5\' 4"  (1.626 m), weight 122.5 kg, last menstrual period 05/06/2018, SpO2 98 %. General appearance: alert, cooperative and no distress Head: Normocephalic, without obvious abnormality, atraumatic Back: symmetric, no curvature. ROM normal. No CVA tenderness. GI: soft, non-tender; bowel sounds normal; no masses,  no organomegaly Extremities: extremities normal, atraumatic, no cyanosis or edema Pulses: 2+ and symmetric Skin: Skin color, texture, turgor normal. No rashes or lesions  Disposition: Discharge disposition: 01-Home or Self Care        Allergies as of 05/15/2018   No Known Allergies     Medication List    TAKE these medications   esomeprazole 20 MG capsule Commonly known as:  NEXIUM Take 20 mg by mouth every other day.   ferrous sulfate 325 (65 FE) MG EC tablet Take 325 mg by mouth 3 (three) times daily with meals.   hydrochlorothiazide 50 MG tablet Commonly known as:  HYDRODIURIL Take 50 mg by mouth daily.   HYDROcodone-acetaminophen 5-325 MG tablet Commonly known as:  NORCO/VICODIN Take 1 tablet by mouth every 4 (four) hours as needed  (breakthrough pain).   ibuprofen 600 MG tablet Commonly known as:  ADVIL,MOTRIN Take 1 tablet (600 mg total) by mouth every 6 (six) hours as needed for fever or headache.   medroxyPROGESTERone 10 MG tablet Commonly known as:  PROVERA Take 2 tablets (20 mg total) by mouth every 8 (eight) hours for 7 days.      Follow-up Information    [facility]. Go on 05/19/2018.   Why:   appointment is on tuesday the 19th Dr. Glennon Mac at 2:10pm Contact information: [address] [place] [address] [phone]          Signed: Hoyt Koch 05/15/2018, 9:50 PM

## 2018-05-15 NOTE — Progress Notes (Signed)
Daily Benign Gynecology Progress Note Barbara Juarez  [id number]  HD#2  Subjective:  Overnight Events: received 2 units pRBCs overnight Complaints: none.  Pain on right flank not present currently. Last dose of morphine about 4 hours ago.  Her bleeding is much improved, but still present. She denies: fevers, chills, chest pain, trouble breathing, nausea, vomiting, severe abdominal pain.  She has tolerated: normal diet as tolerated She is ambulating and is voiding.   Objective:  Most recent vitals Temp: 98.2 F (36.8 C)  BP: 121/60  Pulse Rate: 100  Resp: 20  SpO2: 100 %   Vitals Range over 24 hours Temp  Avg: 98.3 F (36.8 C)  Min: 97.8 F (36.6 C)  Max: 99 F (37.2 C) BP  Min: 118/64  Max: 171/142 Pulse  Avg: 100.7  Min: 85  Max: 121 SpO2  Avg: 99 %  Min: 96 %  Max: 100 %   Physical Exam General: alert, well appearing, and in no distress Extremities: no redness or tenderness in the calves or thighs, no edema  AM Labs Lab Results  Component Value Date   WBC 10.3 05/14/2018   HGB 6.0 (L) 05/14/2018   HCT 21.0 (L) 05/14/2018   PLT 497 (H) 05/14/2018   NA 138 05/14/2018   K 3.0 (L) 05/14/2018   CREATININE 0.93 05/14/2018   BUN 11 05/14/2018   INR 0.96 05/14/2018     Assessment:  Barbara Juarez is a 47 y.o. female HD#1 admitted for severe anemia due to acute blood loss, right lower quadrant abdominal pain, and menorrhagia with irregular cycle.    Plan:  Recheck CBC/BMP at 1100 today (6 hours after end of 2nd transfusion). Home today most likely with close follow up Anticipated Discharge: today  Prentice Docker, MD  05/15/2018 7:55 AM

## 2018-05-15 NOTE — Discharge Instructions (Signed)
When to call the doctor:  Increased vaginal bleeding; passing large blood clots; fever of 101 or higher; lightheadedness/feeling of passing out or passing out; chest pain; difficulty breathing   Medroxyprogesterone tablets What is this medicine? MEDROXYPROGESTERONE (me DROX ee proe JES te rone) is a hormone in a class called progestins. It is commonly used to prevent the uterine lining from overgrowth in women taking an estrogen after menopause. It is also used to treat irregular menstrual bleeding or a lack of menstrual bleeding in women. This medicine may be used for other purposes; ask your health care provider or pharmacist if you have questions. COMMON BRAND NAME(S): Amen, Provera What should I tell my health care provider before I take this medicine? They need to know if you have any of these conditions: -blood vessel disease or a history of a blood clot in the lungs or legs -breast, cervical or vaginal cancer -heart disease -kidney disease -liver disease -migraine -recent miscarriage or abortion -mental depression -migraine -seizures (convulsions) -stroke -vaginal bleeding that has not been evaluated -an unusual or allergic reaction to medroxyprogesterone, other medicines, foods, dyes, or preservatives -pregnant or trying to get pregnant -breast-feeding How should I use this medicine? Take this medicine by mouth with a glass of water. Follow the directions on the prescription label. Take your doses at regular intervals. Do not take your medicine more often than directed. Talk to your pediatrician regarding the use of this medicine in children. Special care may be needed. While this drug may be prescribed for children as young as 13 years for selected conditions, precautions do apply. Overdosage: If you think you have taken too much of this medicine contact a poison control center or emergency room at once. NOTE: This medicine is only for you. Do not share this medicine with  others. What if I miss a dose? If you miss a dose, take it as soon as you can. If it is almost time for your next dose, take only that dose. Do not take double or extra doses. What may interact with this medicine? -barbiturate medicines for inducing sleep or treating seizures (convulsions) -bosentan -carbamazepine -phenytoin -rifampin -St. John's Wort This list may not describe all possible interactions. Give your health care provider a list of all the medicines, herbs, non-prescription drugs, or dietary supplements you use. Also tell them if you smoke, drink alcohol, or use illegal drugs. Some items may interact with your medicine. What should I watch for while using this medicine? Visit your health care professional for regular checks on your progress. You will need a regular breast and pelvic exam. If you have any reason to think you are pregnant, stop taking this medicine at once and contact your doctor or health care professional. What side effects may I notice from receiving this medicine? Side effects that you should report to your doctor or health care professional as soon as possible: -breast tenderness or discharge -changes in mood or emotions, such as depression -changes in vision or speech -pain in the abdomen, chest, groin, or leg -severe headache -skin rash, itching, or hives -sudden shortness of breath -unusually weak or tired -yellowing of skin or eyes Side effects that usually do not require medical attention (report to your doctor or health care professional if they continue or are bothersome): -acne -change in menstrual bleeding pattern or flow -changes in sexual desire -facial hair growth -fluid retention and swelling -headache -upset stomach -weight gain or loss This list may not describe all possible side effects.  Call your doctor for medical advice about side effects. You may report side effects to FDA at 1-800-FDA-1088. Where should I keep my medicine? Keep  out of the reach of children. Store at room temperature between 20 and 25 degrees C (68 and 77 degrees F). Throw away any unused medicine after the expiration date. NOTE: This sheet is a summary. It may not cover all possible information. If you have questions about this medicine, talk to your doctor, pharmacist, or health care provider.  2018 Elsevier/Gold Standard (2008-06-16 11:26:12)

## 2018-05-16 LAB — BPAM RBC
BLOOD PRODUCT EXPIRATION DATE: 201912062359
Blood Product Expiration Date: 201911252359
Blood Product Expiration Date: 201912062359
ISSUE DATE / TIME: 201911150203
ISSUE DATE / TIME: 201911151544
ISSUE DATE / TIME: 201911142222
UNIT TYPE AND RH: 5100
UNIT TYPE AND RH: 9500
Unit Type and Rh: 5100

## 2018-05-16 LAB — TYPE AND SCREEN
ABO/RH(D): O POS
Antibody Screen: NEGATIVE
UNIT DIVISION: 0
UNIT DIVISION: 0
Unit division: 0

## 2018-05-16 LAB — HIV ANTIBODY (ROUTINE TESTING W REFLEX): HIV Screen 4th Generation wRfx: NONREACTIVE

## 2018-05-16 NOTE — Progress Notes (Signed)
All discharge instructions reviewed with pt at 2210; iv removed; pt in wheelchair escorted to car by RN; pt going home with family

## 2018-05-19 ENCOUNTER — Encounter: Payer: Self-pay | Admitting: Obstetrics and Gynecology

## 2018-05-19 ENCOUNTER — Other Ambulatory Visit: Payer: Self-pay | Admitting: Obstetrics & Gynecology

## 2018-05-19 ENCOUNTER — Ambulatory Visit (INDEPENDENT_AMBULATORY_CARE_PROVIDER_SITE_OTHER): Payer: Self-pay | Admitting: Obstetrics and Gynecology

## 2018-05-19 ENCOUNTER — Telehealth: Payer: Self-pay | Admitting: Obstetrics and Gynecology

## 2018-05-19 VITALS — BP 128/84 | Ht 63.0 in | Wt 300.0 lb

## 2018-05-19 DIAGNOSIS — D500 Iron deficiency anemia secondary to blood loss (chronic): Secondary | ICD-10-CM

## 2018-05-19 DIAGNOSIS — N921 Excessive and frequent menstruation with irregular cycle: Secondary | ICD-10-CM

## 2018-05-19 DIAGNOSIS — D259 Leiomyoma of uterus, unspecified: Secondary | ICD-10-CM

## 2018-05-19 DIAGNOSIS — N889 Noninflammatory disorder of cervix uteri, unspecified: Secondary | ICD-10-CM

## 2018-05-19 NOTE — Telephone Encounter (Signed)
Lmtrc

## 2018-05-19 NOTE — Progress Notes (Signed)
Obstetrics & Gynecology Office Visit   Chief Complaint  Patient presents with  . Follow-up  hospitalization for anemia, menorrhagia, right lower quadrant pain  History of Present Illness: 47 y.o. gemale who was admitted 11/14 and 11/15. She was admitted for severe anemia, right lower quadrant pain, and hypokalemia. She received 3 units pRBCs while in the hospital. She was started on Provera which has slowed her bleeding down to spotting. She notes this on the pad when she goes to the bathroom 3-4 times a day.  Her RLQ pain has improved significantly. She was noted to have an enlarged uterus with multiple fibroids. She was also noted to have a cervical lesion, likely consistent with a cervical polyp, based on pelvic exam.  She presents today to discuss more definitive management.   Past Medical History:  Diagnosis Date  . Anemia   . Hypertension     Past Surgical History:  Procedure Laterality Date  . CESAREAN SECTION  1999  . CESAREAN SECTION  07/1991    Gynecologic History: Patient's last menstrual period was 05/06/2018 (approximate).  Obstetric History: [id number]  Family History  Problem Relation Age of Onset  . Diabetes Mother   . Breast cancer Neg Hx     Social History   Socioeconomic History  . Marital status: Married    Spouse name: Not on file  . Number of children: Not on file  . Years of education: Not on file  . Highest education level: Not on file  Occupational History  . Not on file  Social Needs  . Financial resource strain: Not on file  . Food insecurity:    Worry: Not on file    Inability: Not on file  . Transportation needs:    Medical: Not on file    Non-medical: Not on file  Tobacco Use  . Smoking status: Never Smoker  . Smokeless tobacco: Never Used  Substance and Sexual Activity  . Alcohol use: Yes    Alcohol/week: 1.0 standard drinks    Types: 1 Glasses of wine per week    Comment: once a month  . Drug use: No  . Sexual activity: Yes   Birth control/protection: None  Lifestyle  . Physical activity:    Days per week: Not on file    Minutes per session: Not on file  . Stress: Not on file  Relationships  . Social connections:    Talks on phone: Not on file    Gets together: Not on file    Attends religious service: Not on file    Active member of club or organization: Not on file    Attends meetings of clubs or organizations: Not on file    Relationship status: Not on file  . Intimate partner violence:    Fear of current or ex partner: Not on file    Emotionally abused: Not on file    Physically abused: Not on file    Forced sexual activity: Not on file  Other Topics Concern  . Not on file  Social History Narrative  . Not on file    No Known Allergies  Prior to Admission medications   Medication Sig Start Date End Date Taking? Authorizing Provider  esomeprazole (NEXIUM) 20 MG capsule Take 20 mg by mouth every other day.   Yes [provider]  ferrous sulfate 325 (65 FE) MG EC tablet Take 325 mg by mouth 3 (three) times daily with meals.   Yes [provider]  hydrochlorothiazide (HYDRODIURIL)  50 MG tablet Take 50 mg by mouth daily.   Yes [provider]  ibuprofen (ADVIL,MOTRIN) 600 MG tablet Take 1 tablet (600 mg total) by mouth every 6 (six) hours as needed for fever or headache. 05/15/18  Yes Gae Dry, MD  medroxyPROGESTERone (PROVERA) 10 MG tablet Take 2 tablets (20 mg total) by mouth every 8 (eight) hours for 7 days. 05/15/18 05/22/18 Yes Gae Dry, MD  HYDROcodone-acetaminophen (NORCO/VICODIN) 5-325 MG tablet Take 1 tablet by mouth every 4 (four) hours as needed (breakthrough pain). Patient not taking: Reported on 05/19/2018 05/15/18   Gae Dry, MD    Review of Systems  Constitutional: Negative.   HENT: Negative.   Eyes: Negative.   Respiratory: Negative.   Cardiovascular: Negative.   Gastrointestinal: Negative.   Genitourinary: Negative.        See  HPI  Musculoskeletal: Negative.   Skin: Negative.   Neurological: Negative.   Psychiatric/Behavioral: Negative.      Physical Exam BP 128/84   Ht 5\' 3"  (1.6 m)   Wt 300 lb (136.1 kg)   LMP 05/06/2018 (Approximate) Comment: neg hcg  BMI 53.14 kg/m  Patient's last menstrual period was 05/06/2018 (approximate). Physical Exam  Constitutional: She is oriented to person, place, and time. She appears well-developed and well-nourished. No distress.  HENT:  Head: Normocephalic and atraumatic.  Eyes: Conjunctivae are normal. No scleral icterus.  Pulmonary/Chest: No respiratory distress.  Musculoskeletal: Normal range of motion. She exhibits no edema.  Neurological: She is alert and oriented to person, place, and time. No cranial nerve deficit.  Psychiatric: She has a normal mood and affect. Her behavior is normal. Judgment normal.    Assessment: 47 y.o. No obstetric history on file. female here for  1. Menorrhagia with irregular cycle   2. Iron deficiency anemia due to chronic blood loss   3. Uterine leiomyoma, unspecified location   4. Cervical lesion      Plan: Problem List Items Addressed This Visit      Genitourinary   Fibroid uterus     Other   Iron deficiency anemia due to chronic blood loss   Menorrhagia with irregular cycle - Primary   Cervical lesion     Discussed treatment options for the short-term and long term.  Given her cervical lesion and the fact that she has not had endometrial sampling, I recommend a hysteroscopy, dilation and curettage, with removal of cervical lesion. This might decrease her immediate bleeding issue to a great extent given her cervical lesion (likely a polyp).  For the longer term, she will likely need treatment of her fibroids. We discuss medication treatment with an IUD, which she initially accepted (but later changed her mind).  We discuss hysterectomy, which would likely need to be accomplished through an open incision, given the size of her  uterus (extendes to a level well above the umbilicus).  We also discussed uterine artery embolization. She was given a handout on this. She might also be a great candidate for Lupron treatment in preparation for any surgical approach or a near-term approach to get her to treatment.  She will consider longer-term treatment plans. But, today agrees to the short-term plan of hysteroscopy, dilation and curettage, with removal of cervical lesion.  Will plan for that ASAP.  20 minutes spent in face to face discussion with > 50% spent in counseling,management, and coordination of care of her menorrhagia with irregular cycle, cervical lesion, iron deficiency anemia, and fibroid uterus.  Prentice Docker, MD 05/19/2018 2:30 PM

## 2018-05-19 NOTE — Telephone Encounter (Signed)
-----   Message from Will Bonnet, MD sent at 05/19/2018  3:06 PM EST ----- Regarding: Schedule surgery Surgery Booking Request Patient Full Name:  Barbara Juarez  MRN: [id number]  DOB: 03/12/1971  Surgeon: Prentice Docker, MD  Requested Surgery Date and Time: 05/21/2018 Primary Diagnosis AND Code: menorrhagia irregular cycle, endocervical polyp, severe anemia Secondary Diagnosis and Code:  Surgical Procedure: hysteroscopy, dilation and curettage, endocervical polypectomy, Mirena IUD insertion L&D Notification: No Admission Status: same day surgery Length of Surgery: 45 minutes Special Case Needs: MyoSure H&P: done (date) Phone Interview???: no Interpreter: Language:  Medical Clearance: no Special Scheduling Instructions: need Mirena IUD for case

## 2018-05-19 NOTE — H&P (View-Only) (Signed)
Obstetrics & Gynecology Office Visit   Chief Complaint  Patient presents with  . Follow-up  hospitalization for anemia, menorrhagia, right lower quadrant pain  History of Present Illness: 47 y.o. gemale who was admitted 11/14 and 11/15. She was admitted for severe anemia, right lower quadrant pain, and hypokalemia. She received 3 units pRBCs while in the hospital. She was started on Provera which has slowed her bleeding down to spotting. She notes this on the pad when she goes to the bathroom 3-4 times a day.  Her RLQ pain has improved significantly. She was noted to have an enlarged uterus with multiple fibroids. She was also noted to have a cervical lesion, likely consistent with a cervical polyp, based on pelvic exam.  She presents today to discuss more definitive management.   Past Medical History:  Diagnosis Date  . Anemia   . Hypertension     Past Surgical History:  Procedure Laterality Date  . CESAREAN SECTION  1999  . CESAREAN SECTION  07/1991    Gynecologic History: Patient's last menstrual period was 05/06/2018 (approximate).  Obstetric History: [id number]  Family History  Problem Relation Age of Onset  . Diabetes Mother   . Breast cancer Neg Hx     Social History   Socioeconomic History  . Marital status: Married    Spouse name: Not on file  . Number of children: Not on file  . Years of education: Not on file  . Highest education level: Not on file  Occupational History  . Not on file  Social Needs  . Financial resource strain: Not on file  . Food insecurity:    Worry: Not on file    Inability: Not on file  . Transportation needs:    Medical: Not on file    Non-medical: Not on file  Tobacco Use  . Smoking status: Never Smoker  . Smokeless tobacco: Never Used  Substance and Sexual Activity  . Alcohol use: Yes    Alcohol/week: 1.0 standard drinks    Types: 1 Glasses of wine per week    Comment: once a month  . Drug use: No  . Sexual activity: Yes   Birth control/protection: None  Lifestyle  . Physical activity:    Days per week: Not on file    Minutes per session: Not on file  . Stress: Not on file  Relationships  . Social connections:    Talks on phone: Not on file    Gets together: Not on file    Attends religious service: Not on file    Active member of club or organization: Not on file    Attends meetings of clubs or organizations: Not on file    Relationship status: Not on file  . Intimate partner violence:    Fear of current or ex partner: Not on file    Emotionally abused: Not on file    Physically abused: Not on file    Forced sexual activity: Not on file  Other Topics Concern  . Not on file  Social History Narrative  . Not on file    No Known Allergies  Prior to Admission medications   Medication Sig Start Date End Date Taking? Authorizing Provider  esomeprazole (NEXIUM) 20 MG capsule Take 20 mg by mouth every other day.   Yes [provider]  ferrous sulfate 325 (65 FE) MG EC tablet Take 325 mg by mouth 3 (three) times daily with meals.   Yes [provider]  hydrochlorothiazide (HYDRODIURIL)  50 MG tablet Take 50 mg by mouth daily.   Yes [provider]  ibuprofen (ADVIL,MOTRIN) 600 MG tablet Take 1 tablet (600 mg total) by mouth every 6 (six) hours as needed for fever or headache. 05/15/18  Yes Gae Dry, MD  medroxyPROGESTERone (PROVERA) 10 MG tablet Take 2 tablets (20 mg total) by mouth every 8 (eight) hours for 7 days. 05/15/18 05/22/18 Yes Gae Dry, MD  HYDROcodone-acetaminophen (NORCO/VICODIN) 5-325 MG tablet Take 1 tablet by mouth every 4 (four) hours as needed (breakthrough pain). Patient not taking: Reported on 05/19/2018 05/15/18   Gae Dry, MD    Review of Systems  Constitutional: Negative.   HENT: Negative.   Eyes: Negative.   Respiratory: Negative.   Cardiovascular: Negative.   Gastrointestinal: Negative.   Genitourinary: Negative.        See  HPI  Musculoskeletal: Negative.   Skin: Negative.   Neurological: Negative.   Psychiatric/Behavioral: Negative.      Physical Exam BP 128/84   Ht 5\' 3"  (1.6 m)   Wt 300 lb (136.1 kg)   LMP 05/06/2018 (Approximate) Comment: neg hcg  BMI 53.14 kg/m  Patient's last menstrual period was 05/06/2018 (approximate). Physical Exam  Constitutional: She is oriented to person, place, and time. She appears well-developed and well-nourished. No distress.  HENT:  Head: Normocephalic and atraumatic.  Eyes: Conjunctivae are normal. No scleral icterus.  Pulmonary/Chest: No respiratory distress.  Musculoskeletal: Normal range of motion. She exhibits no edema.  Neurological: She is alert and oriented to person, place, and time. No cranial nerve deficit.  Psychiatric: She has a normal mood and affect. Her behavior is normal. Judgment normal.    Assessment: 47 y.o. No obstetric history on file. female here for  1. Menorrhagia with irregular cycle   2. Iron deficiency anemia due to chronic blood loss   3. Uterine leiomyoma, unspecified location   4. Cervical lesion      Plan: Problem List Items Addressed This Visit      Genitourinary   Fibroid uterus     Other   Iron deficiency anemia due to chronic blood loss   Menorrhagia with irregular cycle - Primary   Cervical lesion     Discussed treatment options for the short-term and long term.  Given her cervical lesion and the fact that she has not had endometrial sampling, I recommend a hysteroscopy, dilation and curettage, with removal of cervical lesion. This might decrease her immediate bleeding issue to a great extent given her cervical lesion (likely a polyp).  For the longer term, she will likely need treatment of her fibroids. We discuss medication treatment with an IUD, which she initially accepted (but later changed her mind).  We discuss hysterectomy, which would likely need to be accomplished through an open incision, given the size of her  uterus (extendes to a level well above the umbilicus).  We also discussed uterine artery embolization. She was given a handout on this. She might also be a great candidate for Lupron treatment in preparation for any surgical approach or a near-term approach to get her to treatment.  She will consider longer-term treatment plans. But, today agrees to the short-term plan of hysteroscopy, dilation and curettage, with removal of cervical lesion.  Will plan for that ASAP.  20 minutes spent in face to face discussion with > 50% spent in counseling,management, and coordination of care of her menorrhagia with irregular cycle, cervical lesion, iron deficiency anemia, and fibroid uterus.  Prentice Docker, MD 05/19/2018 2:30 PM

## 2018-05-19 NOTE — Telephone Encounter (Signed)
Patient is aware to arrive on 05/21/18 @ 7:30am at the [facility] registration desk for Pre-admit Testing, and surgery @ 9:30am. Patient is aware to call Same Day Surgery at [phone] between 1-3pm Wednesday to confirm. Patient is aware she may receive calls from the [facility] and [facility]. Patient confirmed she is self-pay. Patient was given the following phone#s: [phone] [facility] Patient [place], [phone] to apply for financial assistance w/in [facility] practices, and [employer]. My phone# and ext were given.

## 2018-05-19 NOTE — Telephone Encounter (Signed)
LARC Assisted device labeled & place on [facility] Work station.

## 2018-05-20 ENCOUNTER — Encounter: Payer: Self-pay | Admitting: Obstetrics and Gynecology

## 2018-05-20 ENCOUNTER — Telehealth: Payer: Self-pay | Admitting: Obstetrics and Gynecology

## 2018-05-20 DIAGNOSIS — N889 Noninflammatory disorder of cervix uteri, unspecified: Secondary | ICD-10-CM | POA: Insufficient documentation

## 2018-05-20 DIAGNOSIS — D259 Leiomyoma of uterus, unspecified: Secondary | ICD-10-CM | POA: Insufficient documentation

## 2018-05-20 HISTORY — DX: Leiomyoma of uterus, unspecified: D25.9

## 2018-05-20 NOTE — Telephone Encounter (Signed)
Patient called to say she has decided against the IUD.

## 2018-05-20 NOTE — Telephone Encounter (Signed)
Noted. Thank you. I will return the [place] to the office.

## 2018-05-20 NOTE — Telephone Encounter (Signed)
I'll return the [place] as the patient no longer wants the device placed during the procedure.  Prentice Docker, MD

## 2018-05-21 ENCOUNTER — Ambulatory Visit
Admission: RE | Admit: 2018-05-21 | Discharge: 2018-05-21 | Disposition: A | Discharge status: Home / Self Care | Payer: Self-pay | Source: Ambulatory Visit | Attending: Obstetrics and Gynecology | Admitting: Obstetrics and Gynecology

## 2018-05-21 ENCOUNTER — Encounter: Payer: Self-pay | Admitting: Anesthesiology

## 2018-05-21 ENCOUNTER — Ambulatory Visit: Payer: Self-pay | Admitting: Anesthesiology

## 2018-05-21 ENCOUNTER — Other Ambulatory Visit: Payer: Self-pay

## 2018-05-21 ENCOUNTER — Encounter
Admission: RE | Disposition: A | Discharge status: Home / Self Care | Payer: Self-pay | Source: Ambulatory Visit | Attending: Obstetrics and Gynecology

## 2018-05-21 DIAGNOSIS — N840 Polyp of corpus uteri: Secondary | ICD-10-CM | POA: Insufficient documentation

## 2018-05-21 DIAGNOSIS — D649 Anemia, unspecified: Secondary | ICD-10-CM

## 2018-05-21 DIAGNOSIS — N921 Excessive and frequent menstruation with irregular cycle: Secondary | ICD-10-CM

## 2018-05-21 DIAGNOSIS — Z79899 Other long term (current) drug therapy: Secondary | ICD-10-CM | POA: Insufficient documentation

## 2018-05-21 DIAGNOSIS — I10 Essential (primary) hypertension: Secondary | ICD-10-CM | POA: Insufficient documentation

## 2018-05-21 DIAGNOSIS — N841 Polyp of cervix uteri: Secondary | ICD-10-CM | POA: Insufficient documentation

## 2018-05-21 DIAGNOSIS — D500 Iron deficiency anemia secondary to blood loss (chronic): Secondary | ICD-10-CM | POA: Diagnosis present

## 2018-05-21 DIAGNOSIS — Z793 Long term (current) use of hormonal contraceptives: Secondary | ICD-10-CM | POA: Insufficient documentation

## 2018-05-21 DIAGNOSIS — D62 Acute posthemorrhagic anemia: Secondary | ICD-10-CM | POA: Diagnosis present

## 2018-05-21 DIAGNOSIS — E876 Hypokalemia: Secondary | ICD-10-CM | POA: Insufficient documentation

## 2018-05-21 DIAGNOSIS — N920 Excessive and frequent menstruation with regular cycle: Secondary | ICD-10-CM | POA: Insufficient documentation

## 2018-05-21 DIAGNOSIS — D259 Leiomyoma of uterus, unspecified: Secondary | ICD-10-CM | POA: Diagnosis present

## 2018-05-21 DIAGNOSIS — N889 Noninflammatory disorder of cervix uteri, unspecified: Secondary | ICD-10-CM | POA: Diagnosis present

## 2018-05-21 DIAGNOSIS — Z791 Long term (current) use of non-steroidal anti-inflammatories (NSAID): Secondary | ICD-10-CM | POA: Insufficient documentation

## 2018-05-21 HISTORY — PX: DILATATION & CURETTAGE/HYSTEROSCOPY WITH MYOSURE: SHX6511

## 2018-05-21 LAB — COMPREHENSIVE METABOLIC PANEL
ALBUMIN: 3.9 g/dL (ref 3.5–5.0)
ALT: 13 U/L (ref 0–44)
AST: 14 U/L — ABNORMAL LOW (ref 15–41)
Alkaline Phosphatase: 61 U/L (ref 38–126)
Anion gap: 10 (ref 5–15)
BILIRUBIN TOTAL: 0.6 mg/dL (ref 0.3–1.2)
BUN: 13 mg/dL (ref 6–20)
CO2: 26 mmol/L (ref 22–32)
Calcium: 9 mg/dL (ref 8.9–10.3)
Chloride: 101 mmol/L (ref 98–111)
Creatinine, Ser: 0.88 mg/dL (ref 0.44–1.00)
GFR calc Af Amer: >60 mL/min (ref >60)
GFR calc non Af Amer: >60 mL/min (ref >60)
GLUCOSE: 112 mg/dL — AB (ref 70–99)
POTASSIUM: 3.4 mmol/L — AB (ref 3.5–5.1)
Sodium: 137 mmol/L (ref 135–145)
TOTAL PROTEIN: 7.8 g/dL (ref 6.5–8.1)

## 2018-05-21 LAB — CBC
HEMATOCRIT: 33.7 % — AB (ref 36.0–46.0)
Hemoglobin: 10 g/dL — ABNORMAL LOW (ref 12.0–15.0)
MCH: 26.2 pg (ref 26.0–34.0)
MCHC: 29.7 g/dL — AB (ref 30.0–36.0)
MCV: 88.5 fL (ref 80.0–100.0)
NRBC: 0 % (ref 0.0–0.2)
PLATELETS: 516 10*3/uL — AB (ref 150–400)
RBC: 3.81 MIL/uL — ABNORMAL LOW (ref 3.87–5.11)
RDW: 22.9 % — AB (ref 11.5–15.5)
WBC: 9.1 10*3/uL (ref 4.0–10.5)

## 2018-05-21 LAB — TYPE AND SCREEN
ABO/RH(D): O POS
Antibody Screen: NEGATIVE
DAT, IGG: NEGATIVE
Weak D: POSITIVE

## 2018-05-21 LAB — POCT PREGNANCY, URINE: Preg Test, Ur: NEGATIVE

## 2018-05-21 SURGERY — DILATATION & CURETTAGE/HYSTEROSCOPY WITH MYOSURE
Anesthesia: General

## 2018-05-21 MED ORDER — MIDAZOLAM HCL 2 MG/2ML IJ SOLN
1.0000 mg | Freq: Once | INTRAMUSCULAR | Status: AC
  Administered 2018-05-21: 1 mg via INTRAVENOUS

## 2018-05-21 MED ORDER — LACTATED RINGERS IV SOLN
INTRAVENOUS | Status: DC | PRN
  Administered 2018-05-21: 10:00:00 via INTRAVENOUS

## 2018-05-21 MED ORDER — MIDAZOLAM HCL 2 MG/2ML IJ SOLN
INTRAMUSCULAR | Status: AC
  Filled 2018-05-21: qty 2

## 2018-05-21 MED ORDER — MEDROXYPROGESTERONE ACETATE 10 MG PO TABS: 10.0000 mg | ORAL_TABLET | Freq: Three times a day (TID) | ORAL | 0 refills | Status: DC

## 2018-05-21 MED ORDER — ACETAMINOPHEN 10 MG/ML IV SOLN
INTRAVENOUS | Status: DC | PRN
  Administered 2018-05-21: 1000 mg via INTRAVENOUS

## 2018-05-21 MED ORDER — HYDROCODONE-ACETAMINOPHEN 5-325 MG PO TABS: 1.0000 | ORAL_TABLET | ORAL | 0 refills | Status: DC | PRN

## 2018-05-21 MED ORDER — FENTANYL CITRATE (PF) 100 MCG/2ML IJ SOLN
INTRAMUSCULAR | Status: AC
  Administered 2018-05-21: 25 ug via INTRAVENOUS
  Filled 2018-05-21: qty 2

## 2018-05-21 MED ORDER — MIDAZOLAM HCL 2 MG/2ML IJ SOLN
INTRAMUSCULAR | Status: DC | PRN
  Administered 2018-05-21: 2 mg via INTRAVENOUS

## 2018-05-21 MED ORDER — DEXAMETHASONE SODIUM PHOSPHATE 10 MG/ML IJ SOLN
INTRAMUSCULAR | Status: DC | PRN
  Administered 2018-05-21: 5 mg via INTRAVENOUS

## 2018-05-21 MED ORDER — LACTATED RINGERS IV SOLN: INTRAVENOUS | Status: DC

## 2018-05-21 MED ORDER — PROPOFOL 500 MG/50ML IV EMUL
INTRAVENOUS | Status: AC
  Filled 2018-05-21: qty 50

## 2018-05-21 MED ORDER — HYDROMORPHONE HCL 1 MG/ML IJ SOLN
INTRAMUSCULAR | Status: DC | PRN
  Administered 2018-05-21: .3 mg via INTRAVENOUS
  Administered 2018-05-21: .2 mg via INTRAVENOUS
  Administered 2018-05-21: 0.5 mg via INTRAVENOUS

## 2018-05-21 MED ORDER — FENTANYL CITRATE (PF) 100 MCG/2ML IJ SOLN
INTRAMUSCULAR | Status: DC | PRN
  Administered 2018-05-21 (×2): 50 ug via INTRAVENOUS

## 2018-05-21 MED ORDER — ONDANSETRON HCL 4 MG/2ML IJ SOLN
INTRAMUSCULAR | Status: DC | PRN
  Administered 2018-05-21: 4 mg via INTRAVENOUS

## 2018-05-21 MED ORDER — KETOROLAC TROMETHAMINE 30 MG/ML IJ SOLN
INTRAMUSCULAR | Status: DC | PRN
  Administered 2018-05-21 (×2): 15 mg via INTRAVENOUS

## 2018-05-21 MED ORDER — FENTANYL CITRATE (PF) 100 MCG/2ML IJ SOLN
INTRAMUSCULAR | Status: AC
  Filled 2018-05-21: qty 2

## 2018-05-21 MED ORDER — MIDAZOLAM HCL 2 MG/2ML IJ SOLN
INTRAMUSCULAR | Status: AC
  Administered 2018-05-21: 1 mg via INTRAVENOUS
  Filled 2018-05-21: qty 2

## 2018-05-21 MED ORDER — HYDROCODONE-ACETAMINOPHEN 5-325 MG PO TABS
1.0000 | ORAL_TABLET | ORAL | Status: DC | PRN
  Administered 2018-05-21: 1 via ORAL

## 2018-05-21 MED ORDER — PHENYLEPHRINE HCL 10 MG/ML IJ SOLN
INTRAMUSCULAR | Status: DC | PRN
  Administered 2018-05-21 (×3): 100 ug via INTRAVENOUS

## 2018-05-21 MED ORDER — ACETAMINOPHEN NICU IV SYRINGE 10 MG/ML
INTRAVENOUS | Status: AC
  Filled 2018-05-21: qty 1

## 2018-05-21 MED ORDER — GLYCOPYRROLATE 0.2 MG/ML IJ SOLN
INTRAMUSCULAR | Status: DC | PRN
  Administered 2018-05-21: 0.2 mg via INTRAVENOUS

## 2018-05-21 MED ORDER — LIDOCAINE HCL (CARDIAC) PF 100 MG/5ML IV SOSY
PREFILLED_SYRINGE | INTRAVENOUS | Status: DC | PRN
  Administered 2018-05-21: 100 mg via INTRAVENOUS

## 2018-05-21 MED ORDER — HYDROCODONE-ACETAMINOPHEN 5-325 MG PO TABS
ORAL_TABLET | ORAL | Status: AC
  Filled 2018-05-21: qty 1

## 2018-05-21 MED ORDER — HYDROMORPHONE HCL 1 MG/ML IJ SOLN
INTRAMUSCULAR | Status: AC
  Filled 2018-05-21: qty 1

## 2018-05-21 MED ORDER — FENTANYL CITRATE (PF) 100 MCG/2ML IJ SOLN
25.0000 ug | INTRAMUSCULAR | Status: AC | PRN
  Administered 2018-05-21 (×6): 25 ug via INTRAVENOUS

## 2018-05-21 MED ORDER — HYDROMORPHONE HCL 1 MG/ML IJ SOLN: 0.2500 mg | INTRAMUSCULAR | Status: DC | PRN

## 2018-05-21 MED ORDER — PROPOFOL 10 MG/ML IV BOLUS
INTRAVENOUS | Status: DC | PRN
  Administered 2018-05-21: 100 mg via INTRAVENOUS
  Administered 2018-05-21: 50 mg via INTRAVENOUS

## 2018-05-21 MED ORDER — ONDANSETRON HCL 4 MG/2ML IJ SOLN: 4.0000 mg | Freq: Once | INTRAMUSCULAR | Status: DC | PRN

## 2018-05-21 SURGICAL SUPPLY — 24 items
BAG URINE DRAINAGE (UROLOGICAL SUPPLIES) IMPLANT
CANISTER SUC SOCK COL 7IN (MISCELLANEOUS) ×2 IMPLANT
CATH FOLEY 2WAY  5CC 16FR (CATHETERS)
CATH ROBINSON RED A/P 16FR (CATHETERS) ×2 IMPLANT
CATH URTH 16FR FL 2W BLN LF (CATHETERS) IMPLANT
COVER WAND RF STERILE (DRAPES) ×2 IMPLANT
DEVICE MYOSURE LITE (MISCELLANEOUS) ×2 IMPLANT
DEVICE MYOSURE REACH (MISCELLANEOUS) ×2 IMPLANT
ELECT REM PT RETURN 9FT ADLT (ELECTROSURGICAL) ×2
ELECTRODE REM PT RTRN 9FT ADLT (ELECTROSURGICAL) ×1 IMPLANT
GLOVE BIO SURGEON STRL SZ7 (GLOVE) ×2 IMPLANT
GLOVE BIOGEL PI IND STRL 7.5 (GLOVE) ×1 IMPLANT
GLOVE BIOGEL PI INDICATOR 7.5 (GLOVE) ×1
GOWN STRL REUS W/ TWL LRG LVL3 (GOWN DISPOSABLE) ×2 IMPLANT
GOWN STRL REUS W/TWL LRG LVL3 (GOWN DISPOSABLE) ×2
IV LACTATED RINGER IRRG 3000ML (IV SOLUTION) ×1
IV LR IRRIG 3000ML ARTHROMATIC (IV SOLUTION) ×1 IMPLANT
KIT PROCEDURE FLUENT (KITS) ×1 IMPLANT
KIT TURNOVER CYSTO (KITS) ×2 IMPLANT
PACK DNC HYST (MISCELLANEOUS) ×2 IMPLANT
PAD OB MATERNITY 4.3X12.25 (PERSONAL CARE ITEMS) ×2 IMPLANT
PAD PREP 24X41 OB/GYN DISP (PERSONAL CARE ITEMS) ×2 IMPLANT
TUBING CONNECTING 10 (TUBING) ×2 IMPLANT
TUBING HYSTEROSCOPY DOLPHIN (MISCELLANEOUS) ×1 IMPLANT

## 2018-05-21 NOTE — Anesthesia Postprocedure Evaluation (Signed)
Anesthesia Post Note  Patient: Barbara Juarez  Procedure(s) Performed: DILATATION & CURETTAGE/HYSTEROSCOPY WITH MYOSURE ENDO CERVICAL POLYPECTOMY (N/A )  Patient location during evaluation: PACU Anesthesia Type: General Level of consciousness: awake and alert Pain management: pain level controlled Vital Signs Assessment: post-procedure vital signs reviewed and stable Respiratory status: spontaneous breathing, nonlabored ventilation, respiratory function stable and patient connected to nasal cannula oxygen Cardiovascular status: blood pressure returned to baseline and stable Postop Assessment: no apparent nausea or vomiting Anesthetic complications: no     Last Vitals:  Vitals:   05/21/18 1244 05/21/18 1259  BP: (!) 144/75 (!) 147/73  Pulse: 80   Resp: 16   Temp: (!) 36.2 C   SpO2: 98% 99%    Last Pain:  Vitals:   05/21/18 1244  TempSrc:   PainSc: [name]

## 2018-05-21 NOTE — Transfer of Care (Signed)
Immediate Anesthesia Transfer of Care Note  Patient: Barbara Juarez  Procedure(s) Performed: DILATATION & CURETTAGE/HYSTEROSCOPY WITH MYOSURE ENDO CERVICAL POLYPECTOMY (N/A )  Patient Location: PACU  Anesthesia Type:General  Level of Consciousness: sedated  Airway & Oxygen Therapy: Patient Spontanous Breathing and Patient connected to face mask oxygen  Post-op Assessment: Report given to RN and Post -op Vital signs reviewed and stable  Post vital signs: Reviewed and stable  Last Vitals:  Vitals Value Taken Time  BP 135/75 05/21/2018 11:53 AM  Temp    Pulse 79 05/21/2018 11:57 AM  Resp 18 05/21/2018 11:57 AM  SpO2 100 % 05/21/2018 11:57 AM  Vitals shown include unvalidated device data.  Last Pain:  Vitals:   05/21/18 1138  TempSrc:   PainSc: 10-Worst pain ever         Complications: No apparent anesthesia complications

## 2018-05-21 NOTE — Interval H&P Note (Signed)
History and Physical Interval Note:  05/21/2018 9:39 AM  Barbara Juarez  has presented today for surgery, with the diagnosis of [place]  The various methods of treatment have been discussed with the patient and family. After consideration of risks, benefits and other options for treatment, the patient has consented to  Procedure(s): DILATATION & CURETTAGE/HYSTEROSCOPY WITH MYOSURE ENDO CERVICAL POLYPECTOMY (N/A) as a surgical intervention .  The patient's history has been reviewed, patient examined, no change in status, stable for surgery.  I have reviewed the patient's chart and labs.  Questions were answered to the patient's satisfaction.    Prentice Docker, MD, [facility], [facility] 05/21/2018 9:39 AM

## 2018-05-21 NOTE — Anesthesia Post-op Follow-up Note (Signed)
Anesthesia QCDR form completed.        

## 2018-05-21 NOTE — Anesthesia Postprocedure Evaluation (Signed)
Anesthesia Post Note  Patient: Barbara Juarez  Procedure(s) Performed: DILATATION & CURETTAGE/HYSTEROSCOPY WITH MYOSURE ENDO CERVICAL POLYPECTOMY (N/A )  Patient location during evaluation: PACU Anesthesia Type: General Level of consciousness: awake and alert Pain management: pain level controlled Vital Signs Assessment: post-procedure vital signs reviewed and stable Respiratory status: spontaneous breathing, nonlabored ventilation, respiratory function stable and patient connected to nasal cannula oxygen Cardiovascular status: blood pressure returned to baseline and stable Postop Assessment: no apparent nausea or vomiting Anesthetic complications: no     Last Vitals:  Vitals:   05/21/18 1244 05/21/18 1259  BP: (!) 144/75 (!) 147/73  Pulse: 80   Resp: 16   Temp: (!) 36.2 C   SpO2: 98% 99%    Last Pain:  Vitals:   05/21/18 1244  TempSrc:   PainSc: [place]

## 2018-05-21 NOTE — Anesthesia Preprocedure Evaluation (Signed)
Anesthesia Evaluation  Patient identified by MRN, date of birth, ID band Patient awake    Reviewed: Allergy & Precautions, NPO status , Patient's Chart, lab work & pertinent test results, reviewed documented beta blocker date and time   Airway Mallampati: III  TM Distance: >3 FB     Dental  (+) Chipped   Pulmonary           Cardiovascular hypertension, Pt. on medications      Neuro/Psych    GI/Hepatic   Endo/Other  Morbid obesity  Renal/GU      Musculoskeletal   Abdominal   Peds  Hematology  (+) anemia ,   Anesthesia Other Findings   Reproductive/Obstetrics                             Anesthesia Physical Anesthesia Plan  ASA: III  Anesthesia Plan: General   Post-op Pain Management:    Induction: Intravenous  PONV Risk Score and Plan:   Airway Management Planned: Oral ETT and LMA  Additional Equipment:   Intra-op Plan:   Post-operative Plan:   Informed Consent: I have reviewed the patients History and Physical, chart, labs and discussed the procedure including the risks, benefits and alternatives for the proposed anesthesia with the patient or authorized representative who has indicated his/her understanding and acceptance.     Plan Discussed with: CRNA  Anesthesia Plan Comments:         Anesthesia Quick Evaluation

## 2018-05-21 NOTE — Anesthesia Procedure Notes (Signed)
Procedure Name: LMA Insertion Performed by: Justus Memory, CRNA Pre-anesthesia Checklist: Patient identified, Patient being monitored, Timeout performed, Emergency Drugs available and Suction available Patient Re-evaluated:Patient Re-evaluated prior to induction Oxygen Delivery Method: Circle system utilized Preoxygenation: Pre-oxygenation with 100% oxygen Induction Type: IV induction Ventilation: Mask ventilation without difficulty LMA: LMA inserted LMA Size: 4.0 Tube type: Oral Number of attempts: 1 Placement Confirmation: positive ETCO2 and breath sounds checked- equal and bilateral Tube secured with: Tape Dental Injury: Teeth and Oropharynx as per pre-operative assessment

## 2018-05-21 NOTE — Op Note (Signed)
Operative Note   05/21/2018  PRE-OP DIAGNOSIS:  1) menorrhagia with irregular cycle 2) fibroid uterus 3) endocervical polyp   POST-OP DIAGNOSIS:  1) menorrhagia with irregular cycle 2) fibroid uterus 3) endocervical polyp   SURGEON: Surgeon(s) and Role:    Will Bonnet, MD - Primary  PROCEDURE: Procedure(s): 1) hysteroscopy 2) dilation and curettage  3) endocervical polypectomy  ANESTHESIA: General LMA  ESTIMATED BLOOD LOSS: 25 mL  DRAINS: none   TOTAL IV FLUIDS: 1,000 mL crystalloid  SPECIMENS:  1) endometrial curetting 2) endocervical polyp (in fragments)  VTE PROPHYLAXIS: SCDs to the bilateral lower extremities  ANTIBIOTICS: none indicated nor given  FLUID DEFICIT: 200 mL (with a significant amount of fluid on the floor that was not accounted)  COMPLICATIONS: none  DISPOSITION: PACU - hemodynamically stable.  CONDITION: stable  FINDINGS: Exam under anesthesia revealed an enlarged uterus extending above the umbilicus with abnormal contour. Hysteroscopy revealed a grossly enlarged uterine cavity extending too far to reach with the hysteroscope.  The endometrial contour was slightly irregular in the lower uterine segment. There also appeared to be a pedunculated polypoid lesion in the endocervix near the internal cervical os.   PROCEDURE IN DETAIL:  After informed consent was obtained, the patient was taken to the operating room where anesthesia was obtained without difficulty. The patient was positioned in the dorsal lithotomy position in [place].  The patient's bladder was catheterized with an in-and-out foley catheter.  The patient was examined under anesthesia, with the above noted findings.  The bi-valved speculum was placed inside the patient's vagina, and the the anterior lip of the cervix was grasped with the tenaculum.  The cervix was progressively dilated to a 7 Hegar dilator.  The hysteroscope was introduced, with the above noted  findings.  The hystersocope was removed and the uterine cavity was curetted as well as reasonably and safely could be accomplished in order to maximize sampling.   A small, right side-wall endocervical polypoid lesion was noted and removed using the MyoSure device with hemostasis noted.    All instruments were removed, with hemostasis noted throughout after application of silver nitrate to the left tenaculum entry site.  She was then taken out of dorsal lithotomy.  The patient tolerated the procedure well.  Sponge, lap and needle counts were correct x2.  The patient was taken to recovery room in excellent condition.  Will Bonnet, MD, [place] 05/21/2018 11:27 AM

## 2018-05-21 NOTE — OR Nursing (Signed)
Discharge instructions discussed with pt and husband. Both voice understanding. 

## 2018-05-22 ENCOUNTER — Encounter: Payer: Self-pay | Admitting: Obstetrics and Gynecology

## 2018-05-22 LAB — SURGICAL PATHOLOGY

## 2018-05-25 ENCOUNTER — Other Ambulatory Visit: Payer: Self-pay | Admitting: Obstetrics and Gynecology

## 2018-05-25 DIAGNOSIS — N921 Excessive and frequent menstruation with irregular cycle: Secondary | ICD-10-CM

## 2018-05-25 DIAGNOSIS — D259 Leiomyoma of uterus, unspecified: Secondary | ICD-10-CM

## 2018-05-25 DIAGNOSIS — N889 Noninflammatory disorder of cervix uteri, unspecified: Secondary | ICD-10-CM

## 2018-05-25 NOTE — Telephone Encounter (Signed)
Please advise 

## 2018-06-05 ENCOUNTER — Telehealth: Payer: Self-pay

## 2018-06-05 ENCOUNTER — Ambulatory Visit (INDEPENDENT_AMBULATORY_CARE_PROVIDER_SITE_OTHER): Payer: Self-pay | Admitting: Obstetrics and Gynecology

## 2018-06-05 ENCOUNTER — Ambulatory Visit: Payer: Self-pay | Admitting: Obstetrics and Gynecology

## 2018-06-05 VITALS — BP 132/84 | Ht 64.0 in | Wt 312.0 lb

## 2018-06-05 DIAGNOSIS — N921 Excessive and frequent menstruation with irregular cycle: Secondary | ICD-10-CM

## 2018-06-05 DIAGNOSIS — D62 Acute posthemorrhagic anemia: Secondary | ICD-10-CM

## 2018-06-05 DIAGNOSIS — D259 Leiomyoma of uterus, unspecified: Secondary | ICD-10-CM

## 2018-06-05 NOTE — Telephone Encounter (Signed)
Pt was seen earlier today by SDJ.  They discussed Hyst.  Pt is asking if it could be done the week of the 16th.  That would give her recovery time during Christmas.  [phone]

## 2018-06-05 NOTE — Progress Notes (Signed)
   Postoperative Follow-up Patient presents post op from hysteroscopy, D&C, polypectomy 2 weeks ago for heavy vaginal bleeding, requiring a blood transfusion.  Subjective: Patient reports little improvement in her preop symptoms. Eating a regular diet without difficulty. The patient is not having any pain.  Activity: normal activities of daily living.  Objective: Vitals:   06/05/18 1053  BP: 132/84   Vital Signs: BP 132/84   Ht 5\' 4"  (1.626 m)   Wt (!) 312 lb (141.5 kg)   LMP 05/06/2018 (Approximate) Comment: neg hcg  BMI 53.55 kg/m  Constitutional: Well nourished, well developed female in no acute distress.  HEENT: normal Skin: Warm and dry.  Extremity: no edema  Abdomen: Soft, non-tender, normal bowel sounds; no bruits, organomegaly or masses.   Pelvic exam: deferred   Assessment: 47 y.o. s/p hysteroscopy, dilation and curettage, polypectomy progressing well  Plan: Patient has done well after surgery with no apparent complications.  I have discussed the post-operative course to date, and the expected progress moving forward.  The patient understands what complications to be concerned about.  I will see the patient in routine follow up, or sooner if needed.    Activity plan: No restriction.  Menorrhagia with irregular cycle, fibroid uterus:  Given her relative lack of response to less invasive surgery, even after removal of polyp, we discussed ongoing options.  She is not a good candidate for combined hormonal medications, given her hypertension.  She is not a candidate for an IUD given the size of her uterine cavity.  For the same reason, she is not a good candidate for endometrial ablation.  We discussed hysterectomy as a definitive management strategy.  She would like to pursue hysterectomy.  Her hysterectomy may have to be an open procedure, given the size of her uterus.  We may be able to attempt the surgery laparoscopically, but I can not guarantee this due to the size of  her uterus. Will schedule.    Prentice Docker 06/05/2018 11:19 AM

## 2018-06-08 ENCOUNTER — Other Ambulatory Visit: Payer: Self-pay | Admitting: Obstetrics and Gynecology

## 2018-06-08 ENCOUNTER — Encounter: Payer: Self-pay | Admitting: Obstetrics and Gynecology

## 2018-06-08 DIAGNOSIS — N889 Noninflammatory disorder of cervix uteri, unspecified: Secondary | ICD-10-CM

## 2018-06-08 DIAGNOSIS — N921 Excessive and frequent menstruation with irregular cycle: Secondary | ICD-10-CM

## 2018-06-08 DIAGNOSIS — D259 Leiomyoma of uterus, unspecified: Secondary | ICD-10-CM

## 2018-06-08 MED ORDER — MEDROXYPROGESTERONE ACETATE 10 MG PO TABS: 20.0000 mg | ORAL_TABLET | Freq: Every day | ORAL | 1 refills | Status: DC

## 2018-06-08 NOTE — Telephone Encounter (Signed)
I just sent this in for the patient prior to seeing this message

## 2018-06-08 NOTE — Telephone Encounter (Signed)
I need to coordinate with Dr. Theora Gianotti, given the complexities of this case.  I have sent in the surgery request. We'll have to see how the schedules line up.  Please let the patient know. Thanks!

## 2018-06-08 NOTE — Addendum Note (Signed)
Addended by: Prentice Docker D on: 06/08/2018 04:17 PM   Modules accepted: Orders

## 2018-06-08 NOTE — Telephone Encounter (Signed)
advise

## 2018-06-09 ENCOUNTER — Ambulatory Visit: Payer: Self-pay | Admitting: Obstetrics and Gynecology

## 2018-06-09 NOTE — Telephone Encounter (Signed)
Pt aware.

## 2018-06-17 ENCOUNTER — Telehealth: Payer: Self-pay | Admitting: Obstetrics and Gynecology

## 2018-06-17 ENCOUNTER — Telehealth: Payer: Self-pay

## 2018-06-17 DIAGNOSIS — D62 Acute posthemorrhagic anemia: Secondary | ICD-10-CM

## 2018-06-17 DIAGNOSIS — D259 Leiomyoma of uterus, unspecified: Secondary | ICD-10-CM

## 2018-06-17 DIAGNOSIS — N921 Excessive and frequent menstruation with irregular cycle: Secondary | ICD-10-CM

## 2018-06-17 DIAGNOSIS — D500 Iron deficiency anemia secondary to blood loss (chronic): Secondary | ICD-10-CM

## 2018-06-17 NOTE — Telephone Encounter (Signed)
-----   Message from Will Bonnet, MD sent at 06/08/2018  4:45 PM EST ----- Regarding: Schedule surgery Surgery Booking Request Patient Full Name:  Barbara Juarez  MRN: [id number]  DOB: 1971-05-02  Surgeon: Prentice Docker, MD  Requested Surgery Date and Time: TBD Primary Diagnosis AND Code: Menorrhagia with irregular menses, fibroid uterus (intramural), chronic blood loss anemia. Secondary Diagnosis and Code:  Surgical Procedure: robot assisted total laparoscopic hysterectomy, bilateral salpingectomy, possible need for laparotomy to accomplish surgery L&D Notification: No Admission Status: surgery admit Length of Surgery: 4 hours Special Case Needs: Davinci SI robot H&P: TBD (date) Phone Interview???: no Interpreter: Language:  Medical Clearance: no Special Scheduling Instructions: MUST BE SURGERY WITH DR. Theora Gianotti. PER DR. Theora Gianotti, WILL REQUIRE HALF DAY TO ACCOMPLISH.

## 2018-06-17 NOTE — Telephone Encounter (Signed)
Spoke to patient on 06/09/18 w/ possible OR dates (07/29/18 or 08/12/18) for Drs Glennon Mac and Theora Gianotti. Discussed patient's [facility] plan, out-of-network benefits, option to re-enroll in [employer], option for referral to [facility]. Emailed Cornelious Bryant for more information.  Contacted the patient today to let her know I received info from Cornelious Bryant, that [facility] will allow the patient to see a provider outside of their network, but would only pay up to their allowed amount, which would result in a higher out of pocket cost to the patient, and that nonparticipating providers may bill up to the difference between what [facility] pays and the actual charge. Out-of-network deductible, coinsurance, and out-of-pocket benefits were discussed with the patient. Patient had a question for Dr Glennon Mac regarding bleeding for last 3 weeks, and said she currently takes 2 pills, 1 x day.

## 2018-06-17 NOTE — Telephone Encounter (Signed)
Advise her to double-up on the medication for now.  So, take 20 mg (two 10 mg tablets twice per day)

## 2018-06-17 NOTE — Telephone Encounter (Signed)
Pt aware via voicemail 

## 2018-06-17 NOTE — Telephone Encounter (Signed)
SDJ rx'd med to slow down pt's bleeding.  Pt states it doesn't seem to be working.  Has been on the for a week.  Not sure how long it takes for them to work.  [phone]

## 2018-06-17 NOTE — Telephone Encounter (Signed)
advise

## 2018-06-18 ENCOUNTER — Other Ambulatory Visit: Payer: Self-pay | Admitting: Obstetrics and Gynecology

## 2018-06-18 DIAGNOSIS — N921 Excessive and frequent menstruation with irregular cycle: Secondary | ICD-10-CM

## 2018-06-18 DIAGNOSIS — D259 Leiomyoma of uterus, unspecified: Secondary | ICD-10-CM

## 2018-06-18 DIAGNOSIS — D62 Acute posthemorrhagic anemia: Secondary | ICD-10-CM

## 2018-06-18 DIAGNOSIS — D500 Iron deficiency anemia secondary to blood loss (chronic): Secondary | ICD-10-CM

## 2018-06-18 MED ORDER — MEDROXYPROGESTERONE ACETATE 10 MG PO TABS: 20.0000 mg | ORAL_TABLET | Freq: Three times a day (TID) | ORAL | 0 refills | Status: DC | PRN

## 2018-06-18 NOTE — Telephone Encounter (Signed)
Spoke with patient. Discussed options for treatment. Based on her insurance status she will likely save a lot of money by utilizing her insurance through [facility] and she would like a referral to the minimally invasive gynecology group. This is due to the size of her uterus and her body habitus.   I will also send in Provera to see if her bleeding can remain controlled until she has the opportunity to get to [facility]. We discussed that this is not a long-term medication.

## 2018-07-16 ENCOUNTER — Ambulatory Visit (INDEPENDENT_AMBULATORY_CARE_PROVIDER_SITE_OTHER): Payer: BLUE CROSS/BLUE SHIELD | Admitting: Family Medicine

## 2018-07-16 ENCOUNTER — Encounter: Payer: Self-pay | Admitting: Family Medicine

## 2018-07-16 VITALS — BP 132/80 | HR 100 | Temp 98.1°F | Resp 16 | Ht 64.0 in | Wt 311.3 lb

## 2018-07-16 DIAGNOSIS — D500 Iron deficiency anemia secondary to blood loss (chronic): Secondary | ICD-10-CM | POA: Diagnosis not present

## 2018-07-16 DIAGNOSIS — Z23 Encounter for immunization: Secondary | ICD-10-CM | POA: Diagnosis not present

## 2018-07-16 DIAGNOSIS — Z7689 Persons encountering health services in other specified circumstances: Secondary | ICD-10-CM

## 2018-07-16 DIAGNOSIS — K219 Gastro-esophageal reflux disease without esophagitis: Secondary | ICD-10-CM | POA: Diagnosis not present

## 2018-07-16 DIAGNOSIS — I10 Essential (primary) hypertension: Secondary | ICD-10-CM | POA: Diagnosis not present

## 2018-07-16 DIAGNOSIS — Z13220 Encounter for screening for lipoid disorders: Secondary | ICD-10-CM

## 2018-07-16 MED ORDER — HYDROCHLOROTHIAZIDE 50 MG PO TABS: 25.0000 mg | ORAL_TABLET | Freq: Every day | ORAL | 1 refills | Status: DC

## 2018-07-16 MED ORDER — FAMOTIDINE 40 MG PO TABS: 40.0000 mg | ORAL_TABLET | Freq: Every day | ORAL | 1 refills | Status: DC

## 2018-07-16 MED ORDER — OMEPRAZOLE 20 MG PO CPDR: 20.0000 mg | DELAYED_RELEASE_CAPSULE | Freq: Every day | ORAL | 1 refills | Status: DC

## 2018-07-16 NOTE — Patient Instructions (Addendum)
To help with reflux: - Elevate your head of bed - either with a few extra pillows, a wedge pillow, or by placing two bed risers under the head of bed posts. - Avoid the following foods: citrus, fatty foods, chocolate, peppermint, and excessive alcohol, along with sodas, orange juice (acidic drinks) - Stop eating at least 3 hours before going to bed, minimize naps/laying down after eating. - No smoking. - If you are overweight or obese, exercising and losing weight will also help your symptoms. - Caution: prolonged use of proton pump inhibitors like omeprazole (Prilosec), pantoprazole (Protonix), esomeprazole (Nexium), and others like Dexilant and Aciphex may increase your risk of pneumonia, Clostridium difficile colitis, osteoporosis, anemia and other health complications  STOP Nexium, START Taking Omeprazole.  Try alternating days of Omeprazole and Famotidine.  Eventually, try to stop taking Omeprazole and only use it as needed. May continue Famotidine daily.

## 2018-07-16 NOTE — Progress Notes (Signed)
Name: Barbara Juarez   MRN: [id number]    DOB: 06-17-1971   Date:07/16/2018       Progress Note  Subjective  Chief Complaint  Chief Complaint  Patient presents with  . Establish Care  . Anemia    would like labs  . Hypertension    HPI Pt presents to establish care and for the following:  Anemia secondary to Menorrhagia/Fibroid Uterus: She had recent hospital admission w/ blood transfusion 05/14/2018.  She is seeing Dr. Grayland Ormond 08/04/2018.  She is planning hysterectomy with Dr. William Hamburger [facility]) in March as long as the patient's Hgb >9.  Receiving iron infusions only when Hgb is too low.  Dr. William Hamburger did do labs on 07/02/2018 and CBC showed Hgb of 8.2, A1C normal, CMP normal. Taking iron supplement TID. Most recent Lupron injection was 07/02/18, still bleeding today, also taking provera. Does take norco PRN for cramps.  Does endorse shortness of breath today, worse than usual - will check CBC today.  HTN: She is prescribed 25mg  by previous PCP ([place] immediate care provider). BP is at goal today.  She is doing well on this medications.  Denies chest pain; does have some baseline shortness of breath (worse when hgb is low).  Headaches when uterine cramping is bad, otherwise no concerns.   Obesity: She is walking regularly, eating lunch in the cafeteria at work, cooks dinner at home. Has not had Lipid panel checked recently - we will check today.   GERD: Taking nexium QAM, endorses burning in chest/epigastrum without medication.  Discussed risk of long term PPI use, verbalizes understanding, we will try to wean her off.  No regurgitation, no difficulty swallowing.   Patient Active Problem List   Diagnosis Date Noted  . Fibroid uterus 05/20/2018  . Cervical lesion 05/20/2018  . Anemia associated with acute blood loss 05/14/2018  . Hypokalemia 05/14/2018  . Right lower quadrant abdominal pain 05/14/2018  . Menorrhagia with irregular cycle 05/14/2018  . Monoclonal gammopathy of  unknown significance (MGUS) 05/27/2016  . Iron deficiency anemia due to chronic blood loss 02/08/2015   Past Surgical History:  Procedure Laterality Date  . CESAREAN SECTION  1999  . CESAREAN SECTION  07/1991  . DILATATION & CURETTAGE/HYSTEROSCOPY WITH MYOSURE N/A 05/21/2018   Procedure: DILATATION & CURETTAGE/HYSTEROSCOPY WITH MYOSURE ENDO CERVICAL POLYPECTOMY;  Surgeon: Will Bonnet, MD;  Location: [facility] ORS;  Service: Gynecology;  Laterality: N/A;   Family History  Problem Relation Age of Onset  . Diabetes Mother   . Breast cancer Neg Hx     Social History   Socioeconomic History  . Marital status: Married    Spouse name: Surveyor, mining  . Number of children: 2  . Years of education: Not on file  . Highest education level: Not on file  Occupational History  . Not on file  Social Needs  . Financial resource strain: Not hard at all  . Food insecurity:    Worry: Never true    Inability: Never true  . Transportation needs:    Medical: No    Non-medical: No  Tobacco Use  . Smoking status: Never Smoker  . Smokeless tobacco: Never Used  Substance and Sexual Activity  . Alcohol use: Yes    Alcohol/week: 1.0 standard drinks    Types: 1 Glasses of wine per week    Comment: once a month  . Drug use: No  . Sexual activity: Yes    Partners: Male    Birth  control/protection: Surgical  Lifestyle  . Physical activity:    Days per week: 5 days    Minutes per session: 30 min  . Stress: Not at all  Relationships  . Social connections:    Talks on phone: More than three times a week    Gets together: More than three times a week    Attends religious service: More than 4 times per year    Active member of club or organization: No    Attends meetings of clubs or organizations: Never    Relationship status: Married  . Intimate partner violence:    Fear of current or ex partner: No    Emotionally abused: No    Physically abused: No    Forced sexual activity: No  Other Topics  Concern  . Not on file  Social History Narrative  . Not on file    Current Outpatient Medications:  .  ferrous sulfate 325 (65 FE) MG EC tablet, Take 325 mg by mouth 3 (three) times daily with meals., Disp: , Rfl:  .  hydrochlorothiazide (HYDRODIURIL) 50 MG tablet, Take 0.5 tablets (25 mg total) by mouth daily., Disp: 90 tablet, Rfl: 1 .  HYDROcodone-acetaminophen (NORCO) 5-325 MG tablet, Take 1 tablet by mouth every 4 (four) hours as needed (breakthrough pain)., Disp: 15 tablet, Rfl: 0 .  ibuprofen (ADVIL,MOTRIN) 600 MG tablet, Take 1 tablet (600 mg total) by mouth every 6 (six) hours as needed for fever or headache., Disp: 30 tablet, Rfl: 0 .  leuprolide (LUPRON) 11.25 MG injection, Inject 11.25 mg into the muscle every 3 (three) months. 1 time only. Hysterectomy scheduled for March, Disp: , Rfl:  .  medroxyPROGESTERone (PROVERA) 10 MG tablet, Take 2 tablets (20 mg total) by mouth 3 (three) times daily as needed (heavy vaginal bleeding)., Disp: 120 tablet, Rfl: 0 .  famotidine (PEPCID) 40 MG tablet, Take 1 tablet (40 mg total) by mouth daily., Disp: 90 tablet, Rfl: 1 .  omeprazole (PRILOSEC) 20 MG capsule, Take 1 capsule (20 mg total) by mouth daily., Disp: 90 capsule, Rfl: 1  No Known Allergies  I personally reviewed active problem list, medication list, allergies, notes from last encounter, lab results with the patient/caregiver today.   ROS Ten systems reviewed and is negative except as mentioned in HPI  Objective  Vitals:   07/16/18 1343  BP: 132/80  Pulse: 100  Resp: 16  Temp: 98.1 F (36.7 C)  TempSrc: Oral  SpO2: 98%  Weight: (!) 311 lb 4.8 oz (141.2 kg)  Height: 5\' 4"  (1.626 m)   Body mass index is 53.43 kg/m.  Physical Exam Constitutional: Patient appears well-developed and well-nourished. No distress.  HENT: Head: Normocephalic and atraumatic.  Neck: Normal range of motion. Neck supple. No JVD present. Cardiovascular: Normal rate, regular rhythm and normal  heart sounds.  No murmur heard. No BLE edema. Pulmonary/Chest: Effort normal and breath sounds normal. No respiratory distress. Abdominal: Soft. Bowel sounds are normal, no distension. There is no tenderness. No masses. Musculoskeletal: Normal range of motion, no joint effusions. No gross deformities Neurological: Pt is alert and oriented to person, place, and time. No cranial nerve deficit. Coordination, balance, strength, speech and gait are normal.  Skin: Skin is warm and dry. No rash noted. No erythema.  Psychiatric: Patient has a normal mood and affect. behavior is normal. Judgment and thought content normal.  No results found for this or any previous visit (from the past 72 hour(s)).   PHQ2/9: Depression screen PHQ  2/9 07/16/2018  Decreased Interest 0  Down, Depressed, Hopeless 0  PHQ - 2 Score 0   Assessment & Plan  1. Gastroesophageal reflux disease without esophagitis - See AVS regarding teaching - omeprazole (PRILOSEC) 20 MG capsule; Take 1 capsule (20 mg total) by mouth daily.  Dispense: 90 capsule; Refill: 1 - famotidine (PEPCID) 40 MG tablet; Take 1 tablet (40 mg total) by mouth daily.  Dispense: 90 tablet; Refill: 1  2. Essential hypertension - DASH diet discussed, weight management - hydrochlorothiazide (HYDRODIURIL) 50 MG tablet; Take 0.5 tablets (25 mg total) by mouth daily.  Dispense: 90 tablet; Refill: 1  3. Morbid obesity ([place]) - Discussed importance of 150 minutes of physical activity weekly, eat two servings of fish weekly, eat one serving of tree nuts ( cashews, pistachios, pecans, almonds.Marland Kitchen) every other day, eat 6 servings of fruit/vegetables daily and drink plenty of water and avoid sweet beverages.  - CBC w/Diff/Platelet - Lipid panel - TSH  4. Iron deficiency anemia due to chronic blood loss - Keep follow up with Dr. Grayland Ormond, will check CBC today due to shortness of breath. - CBC w/Diff/Platelet  5. Lipid screening - Lipid panel  6. Encounter to  establish care  7. Need for Tdap vaccination - Tdap vaccine greater than or equal to 7yo IM

## 2018-07-17 LAB — CBC WITH DIFFERENTIAL/PLATELET
Absolute Monocytes: 485 cells/uL (ref 200–950)
BASOS ABS: 67 {cells}/uL (ref 0–200)
BASOS PCT: 0.7 %
EOS ABS: 247 {cells}/uL (ref 15–500)
Eosinophils Relative: 2.6 %
HCT: 30.6 % — ABNORMAL LOW (ref 35.0–45.0)
Hemoglobin: 9.2 g/dL — ABNORMAL LOW (ref 11.7–15.5)
Lymphs Abs: 2974 cells/uL (ref 850–3900)
MCH: 24.9 pg — AB (ref 27.0–33.0)
MCHC: 30.1 g/dL — ABNORMAL LOW (ref 32.0–36.0)
MCV: 82.9 fL (ref 80.0–100.0)
MPV: 9.8 fL (ref 7.5–12.5)
Monocytes Relative: 5.1 %
Neutro Abs: 5729 cells/uL (ref 1500–7800)
Neutrophils Relative %: 60.3 %
PLATELETS: 654 10*3/uL — AB (ref 140–400)
RBC: 3.69 10*6/uL — ABNORMAL LOW (ref 3.80–5.10)
RDW: 16.3 % — ABNORMAL HIGH (ref 11.0–15.0)
TOTAL LYMPHOCYTE: 31.3 %
WBC: 9.5 10*3/uL (ref 3.8–10.8)

## 2018-07-17 LAB — TSH: TSH: 1.84 m[IU]/L

## 2018-07-17 LAB — LIPID PANEL
CHOL/HDL RATIO: 5.2 (calc) — AB (ref <5.0)
Cholesterol: 176 mg/dL (ref <200)
HDL: 34 mg/dL — ABNORMAL LOW (ref >50)
LDL CHOLESTEROL (CALC): 117 mg/dL — AB
Non-HDL Cholesterol (Calc): 142 mg/dL (calc) — ABNORMAL HIGH (ref <130)
Triglycerides: 137 mg/dL (ref <150)

## 2018-07-20 ENCOUNTER — Encounter: Payer: Self-pay | Admitting: Obstetrics and Gynecology

## 2018-07-21 ENCOUNTER — Encounter: Payer: Self-pay | Admitting: Family Medicine

## 2018-07-31 NOTE — Progress Notes (Signed)
[place]  [phone]  Fax:(336) [place] DOB: [birth year]  MR#: [id number]  FIE#:[id number]  Date of service 02/22/2015.  Patient Care Team: Hubbard Hartshorn, FNP as PCP - General (Family Medicine)  CHIEF COMPLAINT:  Iron deficiency anemia, MGUS.  INTERVAL HISTORY: Patient returns to clinic today for repeat laboratory work and further evaluation.  She is recently admitted to the hospital with significant bleed requiring 3 units of blood and D&C.  She continues to have chronic weakness and fatigue, but otherwise feels well. She has no neurologic complaints. She denies any recent fevers or illnesses. She denies any chest pain or shortness of breath. She denies any nausea, vomiting, constipation, or diarrhea. She has no melena or hematochezia.  She has no urinary complaints.  Patient offers no further specific complaints today.  REVIEW OF SYSTEMS:   Review of Systems  Constitutional: Positive for malaise/fatigue. Negative for fever and weight loss.  Respiratory: Negative.  Negative for cough and shortness of breath.   Cardiovascular: Negative.  Negative for chest pain and leg swelling.  Gastrointestinal: Negative.  Negative for abdominal pain, blood in stool and melena.  Genitourinary: Negative.  Negative for hematuria.  Musculoskeletal: Negative.  Negative for back pain.  Skin: Negative.  Negative for rash.  Neurological: Positive for weakness. Negative for sensory change, focal weakness and headaches.  Psychiatric/Behavioral: Negative.  The patient is not nervous/anxious.     As per HPI. Otherwise, a complete review of systems is negative.  PAST MEDICAL HISTORY: Past Medical History:  Diagnosis Date  . Anemia   . Fibroids   . History of blood transfusion   . Hypertension     PAST SURGICAL HISTORY: Past Surgical History:  Procedure Laterality Date  . CESAREAN SECTION  1999  . CESAREAN SECTION  07/1991  . DILATATION &  CURETTAGE/HYSTEROSCOPY WITH MYOSURE N/A 05/21/2018   Procedure: DILATATION & CURETTAGE/HYSTEROSCOPY WITH MYOSURE ENDO CERVICAL POLYPECTOMY;  Surgeon: Will Bonnet, MD;  Location: [facility] ORS;  Service: Gynecology;  Laterality: N/A;    FAMILY HISTORY Family History  Problem Relation Age of Onset  . Diabetes Mother   . Breast cancer Neg Hx     GYNECOLOGIC HISTORY:  09/07/2015    HEALTH MAINTENANCE: Social History   Tobacco Use  . Smoking status: Never Smoker  . Smokeless tobacco: Never Used  Substance Use Topics  . Alcohol use: Yes    Alcohol/week: 1.0 standard drinks    Types: 1 Glasses of wine per week    Comment: once a month  . Drug use: No    No Known Allergies  Current Outpatient Medications  Medication Sig Dispense Refill  . famotidine (PEPCID) 40 MG tablet Take 1 tablet (40 mg total) by mouth daily. 90 tablet 1  . ferrous sulfate 325 (65 FE) MG EC tablet Take 325 mg by mouth 3 (three) times daily with meals.    . hydrochlorothiazide (HYDRODIURIL) 50 MG tablet Take 0.5 tablets (25 mg total) by mouth daily. 90 tablet 1  . leuprolide (LUPRON) 11.25 MG injection Inject 11.25 mg into the muscle every 3 (three) months. 1 time only. Hysterectomy scheduled for March    . omeprazole (PRILOSEC) 20 MG capsule Take 1 capsule (20 mg total) by mouth daily. 90 capsule 1   No current facility-administered medications for this visit.    Facility-Administered Medications Ordered in Other Visits  Medication Dose Route Frequency Provider Last Rate Last Dose  . 0.9 %  sodium  chloride infusion   Intravenous Continuous Lloyd Huger, MD 20 mL/hr at 08/04/18 1447    . ferumoxytol (FERAHEME) 510 mg in sodium chloride 0.9 % 100 mL IVPB  510 mg Intravenous Once Lloyd Huger, MD 468 mL/hr at 08/04/18 1452 510 mg at 08/04/18 1452    OBJECTIVE: BP (!) 164/86   Pulse 91   Temp 98.2 F (36.8 C) (Tympanic)   Wt (!) 309 lb (140.2 kg)   BMI 53.04 kg/m    Body mass index is 53.04  kg/m.    ECOG FS:0 - Asymptomatic  General: Well-developed, well-nourished, no acute distress. Eyes: Pink conjunctiva, anicteric sclera. HEENT: Normocephalic, moist mucous membranes. Lungs: Clear to auscultation bilaterally. Heart: Regular rate and rhythm. No rubs, murmurs, or gallops. Abdomen: Soft, nontender, nondistended. No organomegaly noted, normoactive bowel sounds. Musculoskeletal: No edema, cyanosis, or clubbing. Neuro: Alert, answering all questions appropriately. Cranial nerves grossly intact. Skin: No rashes or petechiae noted. Psych: Normal affect.  LAB RESULTS:  CBC    Component Value Date/Time   WBC 10.4 08/04/2018 1255   RBC 3.54 (L) 08/04/2018 1255   HGB 8.5 (L) 08/04/2018 1255   HGB 10.6 (L) 09/23/2014 0820   HCT 30.3 (L) 08/04/2018 1255   HCT 33.2 (L) 09/23/2014 0820   PLT 677 (H) 08/04/2018 1255   PLT 420 09/23/2014 0820   MCV 85.6 08/04/2018 1255   MCV 87 09/23/2014 0820   MCH 24.0 (L) 08/04/2018 1255   MCHC 28.1 (L) 08/04/2018 1255   RDW 16.2 (H) 08/04/2018 1255   RDW 16.4 (H) 09/23/2014 0820   LYMPHSABS 2.9 08/04/2018 1255   LYMPHSABS 2.2 09/23/2014 0820   MONOABS 0.6 08/04/2018 1255   MONOABS 0.5 09/23/2014 0820   EOSABS 0.4 08/04/2018 1255   EOSABS 0.4 09/23/2014 0820   BASOSABS 0.1 08/04/2018 1255   BASOSABS 0.1 09/23/2014 0820     Lab Results  Component Value Date   TOTALPROTELP 7.1 04/28/2018   ALBUMINELP 3.4 04/28/2018   A1GS 0.2 04/28/2018   A2GS 0.6 04/28/2018   BETS 1.2 04/28/2018   GAMS 1.7 04/28/2018   MSPIKE 0.8 (H) 04/28/2018   SPEI Comment 04/28/2018    Lab Results  Component Value Date   IRON 20 (L) 04/28/2018   TIBC 378 04/28/2018   IRONPCTSAT 5 (L) 04/28/2018   Lab Results  Component Value Date   FERRITIN 12 04/28/2018      STUDIES: No results found.  ASSESSMENT:  Iron deficiency anemia, MGUS.  PLAN:   1. Iron deficiency anemia: Secondary to heavy menses. Patient's hemoglobin and iron store remain  decreased despite receiving 3 units blood recently. Previously, other than a positive SPEP all of her other laboratory work was either negative or within normal limits.  Proceed with 510 mg IV Feraheme today.  Return to clinic in 1 week for a second infusion.  Patient will then return to clinic the first week of March, 1 week prior to her scheduled hysterectomy for repeat laboratory work, further evaluation, and consideration of IV iron.      2. MGUS: Patient's most recent M spike on April 28, 2018 reported at 0.8 which is unchanged from August 2016.  Patient has an IgG prominence which is less likely to progress to overt multiple myeloma.  Her kappa/lambda light chain ratio is within normal limits.  She has no evidence of endorgan damage.  No intervention is needed at this time.  Patient does not need metastatic bone survey or bone marrow biopsy. Continue to  monitor on a yearly basis. 3.  Thrombocytosis: Resolved. 4.  Heavy menses: Patient reports she is undergoing hysterectomy on September 11, 2018.  OB/GYN surgeon has requested hemoglobin be greater than 9.0 if possible.  Patient expressed understanding and was in agreement with this plan. She also understands that She can call clinic at any time with any questions, concerns, or complaints.    Lloyd Huger, MD   08/04/2018 2:53 PM

## 2018-08-04 ENCOUNTER — Encounter: Payer: Self-pay | Admitting: Oncology

## 2018-08-04 ENCOUNTER — Other Ambulatory Visit: Payer: Self-pay

## 2018-08-04 ENCOUNTER — Ambulatory Visit: Payer: Self-pay

## 2018-08-04 ENCOUNTER — Encounter: Payer: Self-pay | Admitting: Pharmacy Technician

## 2018-08-04 ENCOUNTER — Inpatient Hospital Stay (HOSPITAL_BASED_OUTPATIENT_CLINIC_OR_DEPARTMENT_OTHER): Payer: BLUE CROSS/BLUE SHIELD | Admitting: Oncology

## 2018-08-04 ENCOUNTER — Ambulatory Visit: Payer: Self-pay | Admitting: Oncology

## 2018-08-04 ENCOUNTER — Inpatient Hospital Stay: Payer: BLUE CROSS/BLUE SHIELD

## 2018-08-04 ENCOUNTER — Inpatient Hospital Stay: Payer: BLUE CROSS/BLUE SHIELD | Attending: Oncology

## 2018-08-04 VITALS — BP 131/81 | HR 81 | Resp 18

## 2018-08-04 VITALS — BP 164/86 | HR 91 | Temp 98.2°F | Wt 309.0 lb

## 2018-08-04 DIAGNOSIS — D500 Iron deficiency anemia secondary to blood loss (chronic): Secondary | ICD-10-CM

## 2018-08-04 DIAGNOSIS — N920 Excessive and frequent menstruation with regular cycle: Secondary | ICD-10-CM | POA: Diagnosis not present

## 2018-08-04 DIAGNOSIS — D472 Monoclonal gammopathy: Secondary | ICD-10-CM

## 2018-08-04 LAB — IRON AND TIBC
Iron: 27 ug/dL — ABNORMAL LOW (ref 28–170)
Saturation Ratios: 7 % — ABNORMAL LOW (ref 10.4–31.8)
TIBC: 373 ug/dL (ref 250–450)
UIBC: 346 ug/dL

## 2018-08-04 LAB — CBC WITH DIFFERENTIAL/PLATELET
Abs Immature Granulocytes: 0.04 K/uL (ref 0.00–0.07)
Basophils Absolute: 0.1 K/uL (ref 0.0–0.1)
Basophils Relative: 1 %
Eosinophils Absolute: 0.4 K/uL (ref 0.0–0.5)
Eosinophils Relative: 4 %
HCT: 30.3 % — ABNORMAL LOW (ref 36.0–46.0)
Hemoglobin: 8.5 g/dL — ABNORMAL LOW (ref 12.0–15.0)
Immature Granulocytes: 0 %
Lymphocytes Relative: 28 %
Lymphs Abs: 2.9 K/uL (ref 0.7–4.0)
MCH: 24 pg — ABNORMAL LOW (ref 26.0–34.0)
MCHC: 28.1 g/dL — ABNORMAL LOW (ref 30.0–36.0)
MCV: 85.6 fL (ref 80.0–100.0)
Monocytes Absolute: 0.6 K/uL (ref 0.1–1.0)
Monocytes Relative: 6 %
Neutro Abs: 6.4 K/uL (ref 1.7–7.7)
Neutrophils Relative %: 61 %
Platelets: 677 K/uL — ABNORMAL HIGH (ref 150–400)
RBC: 3.54 MIL/uL — ABNORMAL LOW (ref 3.87–5.11)
RDW: 16.2 % — ABNORMAL HIGH (ref 11.5–15.5)
WBC: 10.4 K/uL (ref 4.0–10.5)
nRBC: 0 % (ref 0.0–0.2)

## 2018-08-04 LAB — FERRITIN: Ferritin: 10 ng/mL — ABNORMAL LOW (ref 11–307)

## 2018-08-04 MED ORDER — SODIUM CHLORIDE 0.9 % IV SOLN
510.0000 mg | Freq: Once | INTRAVENOUS | Status: AC
  Administered 2018-08-04: 510 mg via INTRAVENOUS
  Filled 2018-08-04: qty 17

## 2018-08-04 MED ORDER — SODIUM CHLORIDE 0.9 % IV SOLN
INTRAVENOUS | Status: DC
  Administered 2018-08-04: 15:00:00 via INTRAVENOUS
  Filled 2018-08-04: qty 250

## 2018-08-04 NOTE — Progress Notes (Signed)
Patient no longer getting Feraheme from [facility]. based on [employer] coverage effective 07/01/2018. Last DOS covered is 01/21/18.

## 2018-08-04 NOTE — Progress Notes (Signed)
Patient here today for follow up regarding anemia. Patient reports she is scheduled for total hysterectomy on 3/13. Per patient her surgeon would like her hbg at 9 or greater prior to surgery. Patient was hospitalized and received transfusions x 3 since last clinic visit.

## 2018-08-11 ENCOUNTER — Ambulatory Visit: Payer: Self-pay

## 2018-08-12 ENCOUNTER — Inpatient Hospital Stay: Payer: BLUE CROSS/BLUE SHIELD

## 2018-08-12 VITALS — BP 141/86 | HR 86 | Temp 95.3°F | Resp 18

## 2018-08-12 DIAGNOSIS — D500 Iron deficiency anemia secondary to blood loss (chronic): Secondary | ICD-10-CM

## 2018-08-12 DIAGNOSIS — D472 Monoclonal gammopathy: Secondary | ICD-10-CM | POA: Diagnosis not present

## 2018-08-12 MED ORDER — SODIUM CHLORIDE 0.9 % IV SOLN
510.0000 mg | Freq: Once | INTRAVENOUS | Status: AC
  Administered 2018-08-12: 510 mg via INTRAVENOUS
  Filled 2018-08-12: qty 17

## 2018-08-12 MED ORDER — SODIUM CHLORIDE 0.9 % IV SOLN
INTRAVENOUS | Status: DC
  Administered 2018-08-12: 14:00:00 via INTRAVENOUS
  Filled 2018-08-12: qty 250

## 2018-08-31 NOTE — Progress Notes (Signed)
[place]  [phone]) [phone]  Fax:(336) [place] DOB: Aug 19, 1970  MR#: [id number]  DZH#:[id number]  Date of service 02/22/2015.  Patient Care Team: Hubbard Hartshorn, FNP as PCP - General (Family Medicine)  CHIEF COMPLAINT:  Iron deficiency anemia, MGUS.  INTERVAL HISTORY: Patient returns to clinic today for repeat laboratory work and further evaluation.  She feels significantly improved and no longer complains of weakness and fatigue.  She has had no further bleeding. She has no neurologic complaints. She denies any recent fevers or illnesses. She denies any chest pain or shortness of breath. She denies any nausea, vomiting, constipation, or diarrhea. She has no melena or hematochezia.  She has no urinary complaints.  Patient feels at her baseline offers no specific complaints today.  REVIEW OF SYSTEMS:   Review of Systems  Constitutional: Negative.  Negative for fever, malaise/fatigue and weight loss.  Respiratory: Negative.  Negative for cough and shortness of breath.   Cardiovascular: Negative.  Negative for chest pain and leg swelling.  Gastrointestinal: Negative.  Negative for abdominal pain, blood in stool and melena.  Genitourinary: Negative.  Negative for hematuria.  Musculoskeletal: Negative.  Negative for back pain.  Skin: Negative.  Negative for rash.  Neurological: Negative.  Negative for sensory change, focal weakness, weakness and headaches.  Psychiatric/Behavioral: Negative.  The patient is not nervous/anxious.     As per HPI. Otherwise, a complete review of systems is negative.  PAST MEDICAL HISTORY: Past Medical History:  Diagnosis Date  . Anemia   . Fibroids   . History of blood transfusion   . Hypertension     PAST SURGICAL HISTORY: Past Surgical History:  Procedure Laterality Date  . CESAREAN SECTION  1999  . CESAREAN SECTION  07/1991  . DILATATION & CURETTAGE/HYSTEROSCOPY WITH MYOSURE N/A 05/21/2018   Procedure: DILATATION & CURETTAGE/HYSTEROSCOPY WITH MYOSURE ENDO CERVICAL POLYPECTOMY;  Surgeon: Will Bonnet, MD;  Location: [facility] ORS;  Service: Gynecology;  Laterality: N/A;    FAMILY HISTORY Family History  Problem Relation Age of Onset  . Diabetes Mother   . Breast cancer Neg Hx     GYNECOLOGIC HISTORY:  09/07/2015    HEALTH MAINTENANCE: Social History   Tobacco Use  . Smoking status: Never Smoker  . Smokeless tobacco: Never Used  Substance Use Topics  . Alcohol use: Yes    Alcohol/week: 1.0 standard drinks    Types: 1 Glasses of wine per week    Comment: once a month  . Drug use: No    No Known Allergies  Current Outpatient Medications  Medication Sig Dispense Refill  . famotidine (PEPCID) 40 MG tablet Take 1 tablet (40 mg total) by mouth daily. 90 tablet 1  . ferrous sulfate 325 (65 FE) MG EC tablet Take 325 mg by mouth 3 (three) times daily with meals.    . hydrochlorothiazide (HYDRODIURIL) 50 MG tablet Take 0.5 tablets (25 mg total) by mouth daily. 90 tablet 1  . leuprolide (LUPRON) 11.25 MG injection Inject 11.25 mg into the muscle every 3 (three) months. 1 time only. Hysterectomy scheduled for March    . omeprazole (PRILOSEC) 20 MG capsule Take 1 capsule (20 mg total) by mouth daily. 90 capsule 1   No current facility-administered medications for this visit.     OBJECTIVE: BP (!) 152/89   Pulse 93   Temp 97.7 F (36.5 C) (Tympanic)   Resp 20   Wt (!) 315 lb (142.9 kg)  BMI 54.07 kg/m    Body mass index is 54.07 kg/m.    ECOG FS:0 - Asymptomatic  General: Well-developed, well-nourished, no acute distress. Eyes: Pink conjunctiva, anicteric sclera. HEENT: Normocephalic, moist mucous membranes. Lungs: Clear to auscultation bilaterally. Heart: Regular rate and rhythm. No rubs, murmurs, or gallops. Abdomen: Soft, nontender, nondistended. No organomegaly noted, normoactive bowel sounds. Musculoskeletal: No edema, cyanosis, or clubbing. Neuro: Alert,  answering all questions appropriately. Cranial nerves grossly intact. Skin: No rashes or petechiae noted. Psych: Normal affect.  LAB RESULTS:  CBC    Component Value Date/Time   WBC 8.1 09/01/2018 0948   RBC 4.41 09/01/2018 0948   HGB 11.7 (L) 09/01/2018 0948   HGB 10.6 (L) 09/23/2014 0820   HCT 38.2 09/01/2018 0948   HCT 33.2 (L) 09/23/2014 0820   PLT 391 09/01/2018 0948   PLT 420 09/23/2014 0820   MCV 86.6 09/01/2018 0948   MCV 87 09/23/2014 0820   MCH 26.5 09/01/2018 0948   MCHC 30.6 09/01/2018 0948   RDW 17.4 (H) 09/01/2018 0948   RDW 16.4 (H) 09/23/2014 0820   LYMPHSABS 2.0 09/01/2018 0948   LYMPHSABS 2.2 09/23/2014 0820   MONOABS 0.4 09/01/2018 0948   MONOABS 0.5 09/23/2014 0820   EOSABS 0.5 09/01/2018 0948   EOSABS 0.4 09/23/2014 0820   BASOSABS 0.1 09/01/2018 0948   BASOSABS 0.1 09/23/2014 0820     Lab Results  Component Value Date   TOTALPROTELP 7.1 04/28/2018   ALBUMINELP 3.4 04/28/2018   A1GS 0.2 04/28/2018   A2GS 0.6 04/28/2018   BETS 1.2 04/28/2018   GAMS 1.7 04/28/2018   MSPIKE 0.8 (H) 04/28/2018   SPEI Comment 04/28/2018    Lab Results  Component Value Date   IRON 33 09/01/2018   TIBC 276 09/01/2018   IRONPCTSAT 12 09/01/2018   Lab Results  Component Value Date   FERRITIN 78 09/01/2018      STUDIES: No results found.  ASSESSMENT:  Iron deficiency anemia, MGUS.  PLAN:   1. Iron deficiency anemia: Secondary to heavy menses.  Patient's hemoglobin has significantly improved to 11.7 and her iron stores are now within normal limits. Previously, other than a positive SPEP all of her other laboratory work was either negative or within normal limits.  She does not require additional IV Feraheme today.  Proceed with hysterectomy as scheduled later this week.  Return to clinic in 3 months with repeat laboratory work and further evaluation.   2. MGUS: Patient's most recent M spike on April 28, 2018 reported at 0.8 which is unchanged from August  2016.  Patient has an IgG prominence which is less likely to progress to overt multiple myeloma.  Her kappa/lambda light chain ratio is within normal limits.  She has no evidence of endorgan damage.  No intervention is needed at this time.  Patient does not need metastatic bone survey or bone marrow biopsy. Continue to monitor on a yearly basis. 3.  Thrombocytosis: Resolved. 4.  Heavy menses: Proceed with hysterectomy later this week.  Patient expressed understanding and was in agreement with this plan. She also understands that She can call clinic at any time with any questions, concerns, or complaints.    Lloyd Huger, MD   09/02/2018 5:25 PM

## 2018-09-01 ENCOUNTER — Inpatient Hospital Stay: Payer: BLUE CROSS/BLUE SHIELD

## 2018-09-01 ENCOUNTER — Other Ambulatory Visit: Payer: Self-pay

## 2018-09-01 ENCOUNTER — Inpatient Hospital Stay: Payer: BLUE CROSS/BLUE SHIELD | Attending: Oncology | Admitting: Oncology

## 2018-09-01 ENCOUNTER — Encounter: Payer: Self-pay | Admitting: Oncology

## 2018-09-01 VITALS — BP 152/89 | HR 93 | Temp 97.7°F | Resp 20 | Wt 315.0 lb

## 2018-09-01 DIAGNOSIS — D500 Iron deficiency anemia secondary to blood loss (chronic): Secondary | ICD-10-CM | POA: Diagnosis not present

## 2018-09-01 DIAGNOSIS — N920 Excessive and frequent menstruation with regular cycle: Secondary | ICD-10-CM | POA: Insufficient documentation

## 2018-09-01 DIAGNOSIS — D472 Monoclonal gammopathy: Secondary | ICD-10-CM | POA: Diagnosis not present

## 2018-09-01 LAB — CBC WITH DIFFERENTIAL/PLATELET
Abs Immature Granulocytes: 0.02 10*3/uL (ref 0.00–0.07)
Basophils Absolute: 0.1 10*3/uL (ref 0.0–0.1)
Basophils Relative: 1 %
EOS PCT: 6 %
Eosinophils Absolute: 0.5 10*3/uL (ref 0.0–0.5)
HCT: 38.2 % (ref 36.0–46.0)
Hemoglobin: 11.7 g/dL — ABNORMAL LOW (ref 12.0–15.0)
Immature Granulocytes: 0 %
Lymphocytes Relative: 25 %
Lymphs Abs: 2 10*3/uL (ref 0.7–4.0)
MCH: 26.5 pg (ref 26.0–34.0)
MCHC: 30.6 g/dL (ref 30.0–36.0)
MCV: 86.6 fL (ref 80.0–100.0)
Monocytes Absolute: 0.4 10*3/uL (ref 0.1–1.0)
Monocytes Relative: 5 %
Neutro Abs: 5.2 10*3/uL (ref 1.7–7.7)
Neutrophils Relative %: 63 %
Platelets: 391 10*3/uL (ref 150–400)
RBC: 4.41 MIL/uL (ref 3.87–5.11)
RDW: 17.4 % — ABNORMAL HIGH (ref 11.5–15.5)
WBC: 8.1 10*3/uL (ref 4.0–10.5)
nRBC: 0 % (ref 0.0–0.2)

## 2018-09-01 LAB — IRON AND TIBC
Iron: 33 ug/dL (ref 28–170)
Saturation Ratios: 12 % (ref 10.4–31.8)
TIBC: 276 ug/dL (ref 250–450)
UIBC: 243 ug/dL

## 2018-09-01 LAB — FERRITIN: Ferritin: 78 ng/mL (ref 11–307)

## 2018-09-01 NOTE — Progress Notes (Signed)
Patient here today for follow up regarding anemia.  

## 2018-09-09 ENCOUNTER — Other Ambulatory Visit: Payer: Self-pay | Admitting: Ophthalmology

## 2018-09-09 DIAGNOSIS — H209 Unspecified iridocyclitis: Secondary | ICD-10-CM

## 2018-09-11 HISTORY — PX: ABDOMINAL HYSTERECTOMY: SHX81

## 2018-10-19 ENCOUNTER — Encounter: Payer: Self-pay | Admitting: Family Medicine

## 2018-10-19 ENCOUNTER — Ambulatory Visit (INDEPENDENT_AMBULATORY_CARE_PROVIDER_SITE_OTHER): Payer: BLUE CROSS/BLUE SHIELD | Admitting: Family Medicine

## 2018-10-19 ENCOUNTER — Other Ambulatory Visit: Payer: Self-pay

## 2018-10-19 VITALS — Ht 62.0 in | Wt 290.0 lb

## 2018-10-19 DIAGNOSIS — D500 Iron deficiency anemia secondary to blood loss (chronic): Secondary | ICD-10-CM | POA: Diagnosis not present

## 2018-10-19 DIAGNOSIS — I10 Essential (primary) hypertension: Secondary | ICD-10-CM

## 2018-10-19 DIAGNOSIS — K219 Gastro-esophageal reflux disease without esophagitis: Secondary | ICD-10-CM | POA: Diagnosis not present

## 2018-10-19 DIAGNOSIS — E7800 Pure hypercholesterolemia, unspecified: Secondary | ICD-10-CM | POA: Insufficient documentation

## 2018-10-19 MED ORDER — FAMOTIDINE 40 MG PO TABS: 40.0000 mg | ORAL_TABLET | Freq: Every day | ORAL | 1 refills | Status: DC

## 2018-10-19 MED ORDER — HYDROCHLOROTHIAZIDE 25 MG PO TABS: 25.0000 mg | ORAL_TABLET | Freq: Every day | ORAL | 1 refills | Status: DC

## 2018-10-19 NOTE — Progress Notes (Signed)
Name: Barbara Juarez   MRN: [id number]    DOB: 1971/04/23   Date:10/19/2018       Progress Note  Subjective  Chief Complaint  Chief Complaint  Patient presents with  . Follow-up    I connected with  Con Memos  on 10/19/18 at  8:00 AM EDT by a video enabled telemedicine application and verified that I am speaking with the correct person using two identifiers.  I discussed the limitations of evaluation and management by telemedicine and the availability of in person appointments. The patient expressed understanding and agreed to proceed. Staff also discussed with the patient that there may be a patient responsible charge related to this service. Patient Location: Home Provider Location: Home Additional Individuals present: None  HPI  Anemia secondary to Menorrhagia/Fibroid Uterus: She had hospital admission w/ blood transfusion 05/14/2018.  She is seeing Dr. Grayland Ormond in June.  She had total hysterectomy 09/11/2018 and had smooth recovery. No longer haing iron infusions. Taking iron supplement  Her fatigue and shortness of breath are much improved.   HTN: She is prescribed 25mg  HCTZ.   She is doing well on this medications.  Denies chest pain, no shortness of breath, headaches, no BLE edema. DASH diet discussed in detail.  Obesity/Elevated LDL: She is walking regularly, cooks dinner at home. 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 Lipids showed elevated LDL and low HDL, normal TSH.  We will recheck CMP at next visit to determine if A1C is needed. HLD diet discussed in detail.  GERD: Taking Pepcid once daily.  She switched over to famotidine since last visit and is doing very well.  No regurgitation, no difficulty swallowing, no stomach upset.   Patient Active Problem List   Diagnosis Date Noted  . Fibroid uterus 05/20/2018  . Cervical lesion 05/20/2018  . Anemia associated with acute blood loss 05/14/2018  . Hypokalemia 05/14/2018  . Right  lower quadrant abdominal pain 05/14/2018  . Menorrhagia with irregular cycle 05/14/2018  . Monoclonal gammopathy of unknown significance (MGUS) 05/27/2016  . Iron deficiency anemia due to chronic blood loss 02/08/2015    Past Surgical History:  Procedure Laterality Date  . CESAREAN SECTION  1999  . CESAREAN SECTION  07/1991  . DILATATION & CURETTAGE/HYSTEROSCOPY WITH MYOSURE N/A 05/21/2018   Procedure: DILATATION & CURETTAGE/HYSTEROSCOPY WITH MYOSURE ENDO CERVICAL POLYPECTOMY;  Surgeon: Will Bonnet, MD;  Location: [facility] ORS;  Service: Gynecology;  Laterality: N/A;    Family History  Problem Relation Age of Onset  . Diabetes Mother   . Breast cancer Neg Hx     Social History   Socioeconomic History  . Marital status: Married    Spouse name: Surveyor, mining  . Number of children: 2  . Years of education: Not on file  . Highest education level: Not on file  Occupational History  . Not on file  Social Needs  . Financial resource strain: Not hard at all  . Food insecurity:    Worry: Never true    Inability: Never true  . Transportation needs:    Medical: No    Non-medical: No  Tobacco Use  . Smoking status: Never Smoker  . Smokeless tobacco: Never Used  Substance and Sexual Activity  . Alcohol use: Yes    Alcohol/week: 1.0 standard drinks    Types: 1 Glasses of wine per week    Comment: once a month  . Drug use: No  . Sexual activity: Yes  Partners: Male    Birth control/protection: Surgical  Lifestyle  . Physical activity:    Days per week: 5 days    Minutes per session: 30 min  . Stress: Not at all  Relationships  . Social connections:    Talks on phone: More than three times a week    Gets together: More than three times a week    Attends religious service: More than 4 times per year    Active member of club or organization: No    Attends meetings of clubs or organizations: Never    Relationship status: Married  . Intimate partner violence:    Fear of  current or ex partner: No    Emotionally abused: No    Physically abused: No    Forced sexual activity: No  Other Topics Concern  . Not on file  Social History Narrative  . Not on file     Current Outpatient Medications:  .  famotidine (PEPCID) 40 MG tablet, Take 1 tablet (40 mg total) by mouth daily., Disp: 90 tablet, Rfl: 1 .  ferrous sulfate 325 (65 FE) MG EC tablet, Take 325 mg by mouth 3 (three) times daily with meals., Disp: , Rfl:  .  hydrochlorothiazide (HYDRODIURIL) 25 MG tablet, Take 1 tablet (25 mg total) by mouth daily., Disp: 90 tablet, Rfl: 1  No Known Allergies  I personally reviewed active problem list, medication list, allergies, health maintenance, notes from last encounter, lab results with the patient/caregiver today.   ROS Constitutional: Negative for fever or weight change.  Respiratory: Negative for cough and shortness of breath.   Cardiovascular: Negative for chest pain or palpitations.  Gastrointestinal: Negative for abdominal pain, no bowel changes.  Musculoskeletal: Negative for gait problem or joint swelling.  Skin: Negative for rash.  Neurological: Negative for dizziness or headache.  No other specific complaints in a complete review of systems (except as listed in HPI above).  Objective  Virtual encounter, vitals not obtained.  Body mass index is 53.04 kg/m.  Physical Exam  Constitutional: Patient appears well-developed and well-nourished. No distress.  HENT: Head: Normocephalic and atraumatic.  Neck: Normal range of motion. Pulmonary/Chest: Effort normal. No respiratory distress. Speaking in complete sentences Neurological: Pt is alert and oriented to person, place, and time. Coordination, speech are normal.  Psychiatric: Patient has a normal mood and affect. behavior is normal. Judgment and thought content normal.  No results found for this or any previous visit (from the past 72 hour(s)).  PHQ2/9: Depression screen [facility] 2/9 10/19/2018  07/16/2018  Decreased Interest 0 0  Down, Depressed, Hopeless 0 0  PHQ - 2 Score 0 0  Altered sleeping 3 -  Tired, decreased energy 3 -  Change in appetite 0 -  Feeling bad or failure about yourself  0 -  Trouble concentrating 0 -  Moving slowly or fidgety/restless 0 -  Suicidal thoughts 0 -  PHQ-9 Score 6 -  Difficult doing work/chores Not difficult at all -   PHQ-2/9 Result is negative.    Assessment & Plan  1. Gastroesophageal reflux disease without esophagitis - Doing well off of PPI, continue pepcid and trigger avoidance. - famotidine (PEPCID) 40 MG tablet; Take 1 tablet (40 mg total) by mouth daily.  Dispense: 90 tablet; Refill: 1  2. Essential hypertension - DASH diet discussed. - hydrochlorothiazide (HYDRODIURIL) 25 MG tablet; Take 1 tablet (25 mg total) by mouth daily.  Dispense: 90 tablet; Refill: 1  3. Morbid obesity ([place]) - Discussed  importance of 150 minutes of physical activity weekly, eat two servings of fish weekly, eat one serving of tree nuts ( cashews, pistachios, pecans, almonds.Marland Kitchen) every other day, eat 6 servings of fruit/vegetables daily and drink plenty of water and avoid sweet beverages.  4. Iron deficiency anemia due to chronic blood loss - Doing well after hysterectomy, signs and symptoms of anemia has been dissipating.  She will follow up with Dr. Grayland Ormond in June.  5. Elevated LDL cholesterol level ASCVD Risk score at last visit was 5.8%, discussed dietary changes at length, she will use Mychart to review HLD diet.  I discussed the assessment and treatment plan with the patient. The patient was provided an opportunity to ask questions and all were answered. The patient agreed with the plan and demonstrated an understanding of the instructions.  The patient was advised to call back or seek an in-person evaluation if the symptoms worsen or if the condition fails to improve as anticipated.  I provided 16 minutes of non-face-to-face time during this  encounter.

## 2018-10-19 NOTE — Patient Instructions (Signed)
Fat and Cholesterol Restricted Eating Plan Getting too much fat and cholesterol in your diet may cause health problems. Choosing the right foods helps keep your fat and cholesterol at normal levels. This can keep you from getting certain diseases. Your doctor may recommend an eating plan that includes:  Total fat: ______% or less of total calories a day.  Saturated fat: ______% or less of total calories a day.  Cholesterol: less than _________mg a day.  Fiber: ______g a day. What are tips for following this plan? Meal planning  At meals, divide your plate into four equal parts: ? Fill one-half of your plate with vegetables and green salads. ? Fill one-fourth of your plate with whole grains. ? Fill one-fourth of your plate with low-fat (lean) protein foods.  Eat fish that is high in omega-3 fats at least two times a week. This includes mackerel, tuna, sardines, and salmon.  Eat foods that are high in fiber, such as whole grains, beans, apples, broccoli, carrots, peas, and barley. General tips   Work with your doctor to lose weight if you need to.  Avoid: ? Foods with added sugar. ? Fried foods. ? Foods with partially hydrogenated oils.  Limit alcohol intake to no more than 1 drink a day for nonpregnant women and 2 drinks a day for men. One drink equals 12 oz of beer, 5 oz of wine, or 1 oz of hard liquor. Reading food labels  Check food labels for: ? Trans fats. ? Partially hydrogenated oils. ? Saturated fat (g) in each serving. ? Cholesterol (mg) in each serving. ? Fiber (g) in each serving.  Choose foods with healthy fats, such as: ? Monounsaturated fats. ? Polyunsaturated fats. ? Omega-3 fats.  Choose grain products that have whole grains. Look for the word "whole" as the first word in the ingredient list. Cooking  Cook foods using low-fat methods. These include baking, boiling, grilling, and broiling.  Eat more home-cooked foods. Eat at restaurants and buffets  less often.  Avoid cooking using saturated fats, such as butter, cream, palm oil, palm kernel oil, and coconut oil. Recommended foods  Fruits  All fresh, canned (in natural juice), or frozen fruits. Vegetables  Fresh or frozen vegetables (raw, steamed, roasted, or grilled). Green salads. Grains  Whole grains, such as whole wheat or whole grain breads, crackers, cereals, and pasta. Unsweetened oatmeal, bulgur, barley, quinoa, or brown rice. Corn or whole wheat flour tortillas. Meats and other protein foods  Ground beef (85% or leaner), grass-fed beef, or beef trimmed of fat. Skinless chicken or turkey. Ground chicken or turkey. Pork trimmed of fat. All fish and seafood. Egg whites. Dried beans, peas, or lentils. Unsalted nuts or seeds. Unsalted canned beans. Nut butters without added sugar or oil. Dairy  Low-fat or nonfat dairy products, such as skim or 1% milk, 2% or reduced-fat cheeses, low-fat and fat-free ricotta or cottage cheese, or plain low-fat and nonfat yogurt. Fats and oils  Tub margarine without trans fats. Light or reduced-fat mayonnaise and salad dressings. Avocado. Olive, canola, sesame, or safflower oils. The items listed above may not be a complete list of foods and beverages you can eat. Contact a dietitian for more information. Foods to avoid Fruits  Canned fruit in heavy syrup. Fruit in cream or butter sauce. Fried fruit. Vegetables  Vegetables cooked in cheese, cream, or butter sauce. Fried vegetables. Grains  White bread. White pasta. White rice. Cornbread. Bagels, pastries, and croissants. Crackers and snack foods that contain trans fat   and hydrogenated oils. Meats and other protein foods  Fatty cuts of meat. Ribs, chicken wings, bacon, sausage, bologna, salami, chitterlings, fatback, hot dogs, bratwurst, and packaged lunch meats. Liver and organ meats. Whole eggs and egg yolks. Chicken and turkey with skin. Fried meat. Dairy  Whole or 2% milk, cream,  half-and-half, and cream cheese. Whole milk cheeses. Whole-fat or sweetened yogurt. Full-fat cheeses. Nondairy creamers and whipped toppings. Processed cheese, cheese spreads, and cheese curds. Beverages  Alcohol. Sugar-sweetened drinks such as sodas, lemonade, and fruit drinks. Fats and oils  Butter, stick margarine, lard, shortening, ghee, or bacon fat. Coconut, palm kernel, and palm oils. Sweets and desserts  Corn syrup, sugars, honey, and molasses. Candy. Jam and jelly. Syrup. Sweetened cereals. Cookies, pies, cakes, donuts, muffins, and ice cream. The items listed above may not be a complete list of foods and beverages you should avoid. Contact a dietitian for more information. Summary  Choosing the right foods helps keep your fat and cholesterol at normal levels. This can keep you from getting certain diseases.  At meals, fill one-half of your plate with vegetables and green salads.  Eat high-fiber foods, like whole grains, beans, apples, carrots, peas, and barley.  Limit added sugar, saturated fats, alcohol, and fried foods. This information is not intended to replace advice given to you by your health care provider. Make sure you discuss any questions you have with your health care provider. Document Released: 12/17/2011 Document Revised: 02/18/2018 Document Reviewed: 03/04/2017 Elsevier Interactive Patient Education  2019 Elsevier Inc.   

## 2018-11-27 NOTE — Progress Notes (Signed)
Barbara Juarez  [phone]  Fax:([phone]) [place] DOB: [birth year]  MR#: [id number]  PQD#:[id number]  Date of service 02/22/2015.  Patient Care Team: Hubbard Hartshorn, FNP as PCP - General (Family Medicine)  CHIEF COMPLAINT:  Iron deficiency anemia, MGUS.  INTERVAL HISTORY: Patient returns to clinic today for repeat laboratory work and further evaluation.  She currently feels well and is asymptomatic.  She does not complain of any weakness or fatigue.  Since her hysterectomy, her heavy menstrual bleeding has ceased. She has no neurologic complaints. She denies any recent fevers or illnesses.  She denies any chest pain, shortness of breath, cough, or hemoptysis.  She denies any nausea, vomiting, constipation, or diarrhea. She has no melena or hematochezia.  She has no urinary complaints.  Patient offers no specific complaints today.  REVIEW OF SYSTEMS:   Review of Systems  Constitutional: Negative.  Negative for fever, malaise/fatigue and weight loss.  Respiratory: Negative.  Negative for cough and shortness of breath.   Cardiovascular: Negative.  Negative for chest pain and leg swelling.  Gastrointestinal: Negative.  Negative for abdominal pain, blood in stool and melena.  Genitourinary: Negative.  Negative for hematuria.  Musculoskeletal: Negative.  Negative for back pain.  Skin: Negative.  Negative for rash.  Neurological: Negative.  Negative for sensory change, focal weakness, weakness and headaches.  Psychiatric/Behavioral: Negative.  The patient is not nervous/anxious.     As per HPI. Otherwise, a complete review of systems is negative.  PAST MEDICAL HISTORY: Past Medical History:  Diagnosis Date  . Anemia   . Anemia associated with acute blood loss 05/14/2018  . Fibroid uterus 05/20/2018  . Fibroids   . History of blood transfusion   . Hypertension   . Menorrhagia with irregular cycle 05/14/2018    PAST SURGICAL HISTORY: Past  Surgical History:  Procedure Laterality Date  . CESAREAN SECTION  1999  . CESAREAN SECTION  07/1991  . DILATATION & CURETTAGE/HYSTEROSCOPY WITH MYOSURE N/A 05/21/2018   Procedure: DILATATION & CURETTAGE/HYSTEROSCOPY WITH MYOSURE ENDO CERVICAL POLYPECTOMY;  Surgeon: Will Bonnet, MD;  Location: [facility] ORS;  Service: Gynecology;  Laterality: N/A;    FAMILY HISTORY Family History  Problem Relation Age of Onset  . Diabetes Mother   . Breast cancer Neg Hx     GYNECOLOGIC HISTORY:  09/07/2015    HEALTH MAINTENANCE: Social History   Tobacco Use  . Smoking status: Never Smoker  . Smokeless tobacco: Never Used  Substance Use Topics  . Alcohol use: Yes    Alcohol/week: 1.0 standard drinks    Types: 1 Glasses of wine per week    Comment: once a month  . Drug use: No    No Known Allergies  Current Outpatient Medications  Medication Sig Dispense Refill  . famotidine (PEPCID) 40 MG tablet Take 1 tablet (40 mg total) by mouth daily. 90 tablet 1  . ferrous sulfate 325 (65 FE) MG EC tablet Take 325 mg by mouth 3 (three) times daily with meals.    . hydrochlorothiazide (HYDRODIURIL) 25 MG tablet Take 1 tablet (25 mg total) by mouth daily. 90 tablet 1   No current facility-administered medications for this visit.     OBJECTIVE: BP 140/90 (BP Location: Right Arm, Patient Position: Sitting)   Pulse 75   Temp 98.7 F (37.1 C) (Tympanic)   Ht '5\' 3"'$  (1.6 m)   Wt (!) 311 lb (141.1 kg)   BMI 55.09 kg/m  Body mass index is 55.09 kg/m.    ECOG FS:0 - Asymptomatic  General: Well-developed, well-nourished, no acute distress. Eyes: Pink conjunctiva, anicteric sclera. HEENT: Normocephalic, moist mucous membranes. Lungs: Clear to auscultation bilaterally. Heart: Regular rate and rhythm. No rubs, murmurs, or gallops. Abdomen: Soft, nontender, nondistended. No organomegaly noted, normoactive bowel sounds. Musculoskeletal: No edema, cyanosis, or clubbing. Neuro: Alert, answering all  questions appropriately. Cranial nerves grossly intact. Skin: No rashes or petechiae noted. Psych: Normal affect.  LAB RESULTS:  CBC    Component Value Date/Time   WBC 8.3 12/01/2018 1244   RBC 4.30 12/01/2018 1244   HGB 12.7 12/01/2018 1244   HGB 10.6 (L) 09/23/2014 0820   HCT 38.1 12/01/2018 1244   HCT 33.2 (L) 09/23/2014 0820   PLT 369 12/01/2018 1244   PLT 420 09/23/2014 0820   MCV 88.6 12/01/2018 1244   MCV 87 09/23/2014 0820   MCH 29.5 12/01/2018 1244   MCHC 33.3 12/01/2018 1244   RDW 15.7 (H) 12/01/2018 1244   RDW 16.4 (H) 09/23/2014 0820   LYMPHSABS 2.7 12/01/2018 1244   LYMPHSABS 2.2 09/23/2014 0820   MONOABS 0.5 12/01/2018 1244   MONOABS 0.5 09/23/2014 0820   EOSABS 0.2 12/01/2018 1244   EOSABS 0.4 09/23/2014 0820   BASOSABS 0.0 12/01/2018 1244   BASOSABS 0.1 09/23/2014 0820     Lab Results  Component Value Date   TOTALPROTELP 7.1 04/28/2018   ALBUMINELP 3.4 04/28/2018   A1GS 0.2 04/28/2018   A2GS 0.6 04/28/2018   BETS 1.2 04/28/2018   GAMS 1.7 04/28/2018   MSPIKE 0.8 (H) 04/28/2018   SPEI Comment 04/28/2018    Lab Results  Component Value Date   IRON 63 12/01/2018   TIBC 306 12/01/2018   IRONPCTSAT 21 12/01/2018   Lab Results  Component Value Date   FERRITIN 94 12/01/2018      STUDIES: No results found.  ASSESSMENT:  Iron deficiency anemia, MGUS.  PLAN:   1. Iron deficiency anemia: Resolved with hysterectomy.  Patient's hemoglobin and iron stores are now within normal limits.  No intervention is needed at this time.  Return to clinic in 6 months with repeat laboratory work and further evaluation.    2. MGUS: Patient's most recent M spike on April 28, 2018 reported at 0.8 which is unchanged from August 2016.  Patient has an IgG prominence which is less likely to progress to overt multiple myeloma.  Her kappa/lambda light chain ratio is within normal limits.  She has no evidence of endorgan damage.  No intervention is needed at this time.   Patient does not need metastatic bone survey or bone marrow biopsy.  Recheck laboratory work in 6 months as above at which point patient can be likely  transitioned to yearly visits.  I spent a total of 20 minutes face-to-face with the patient of which greater than 50% of the visit was spent in counseling and coordination of care as detailed above.   Patient expressed understanding and was in agreement with this plan. She also understands that She can call clinic at any time with any questions, concerns, or complaints.    Lloyd Huger, MD   12/03/2018 6:33 AM

## 2018-12-01 ENCOUNTER — Other Ambulatory Visit: Payer: Self-pay

## 2018-12-01 ENCOUNTER — Inpatient Hospital Stay (HOSPITAL_BASED_OUTPATIENT_CLINIC_OR_DEPARTMENT_OTHER): Payer: BLUE CROSS/BLUE SHIELD | Admitting: Oncology

## 2018-12-01 ENCOUNTER — Encounter: Payer: Self-pay | Admitting: Oncology

## 2018-12-01 ENCOUNTER — Inpatient Hospital Stay: Payer: BLUE CROSS/BLUE SHIELD

## 2018-12-01 ENCOUNTER — Inpatient Hospital Stay: Payer: BLUE CROSS/BLUE SHIELD | Attending: Oncology

## 2018-12-01 VITALS — BP 140/90 | HR 75 | Temp 98.7°F | Ht 63.0 in | Wt 311.0 lb

## 2018-12-01 DIAGNOSIS — D500 Iron deficiency anemia secondary to blood loss (chronic): Secondary | ICD-10-CM

## 2018-12-01 DIAGNOSIS — D472 Monoclonal gammopathy: Secondary | ICD-10-CM | POA: Insufficient documentation

## 2018-12-01 DIAGNOSIS — D509 Iron deficiency anemia, unspecified: Secondary | ICD-10-CM

## 2018-12-01 LAB — CBC WITH DIFFERENTIAL/PLATELET
Abs Immature Granulocytes: 0.03 10*3/uL (ref 0.00–0.07)
Basophils Absolute: 0 10*3/uL (ref 0.0–0.1)
Basophils Relative: 0 %
Eosinophils Absolute: 0.2 10*3/uL (ref 0.0–0.5)
Eosinophils Relative: 2 %
HCT: 38.1 % (ref 36.0–46.0)
Hemoglobin: 12.7 g/dL (ref 12.0–15.0)
Immature Granulocytes: 0 %
Lymphocytes Relative: 33 %
Lymphs Abs: 2.7 10*3/uL (ref 0.7–4.0)
MCH: 29.5 pg (ref 26.0–34.0)
MCHC: 33.3 g/dL (ref 30.0–36.0)
MCV: 88.6 fL (ref 80.0–100.0)
Monocytes Absolute: 0.5 10*3/uL (ref 0.1–1.0)
Monocytes Relative: 6 %
Neutro Abs: 4.9 10*3/uL (ref 1.7–7.7)
Neutrophils Relative %: 59 %
Platelets: 369 10*3/uL (ref 150–400)
RBC: 4.3 MIL/uL (ref 3.87–5.11)
RDW: 15.7 % — ABNORMAL HIGH (ref 11.5–15.5)
WBC: 8.3 10*3/uL (ref 4.0–10.5)
nRBC: 0 % (ref 0.0–0.2)

## 2018-12-01 LAB — IRON AND TIBC
Iron: 63 ug/dL (ref 28–170)
Saturation Ratios: 21 % (ref 10.4–31.8)
TIBC: 306 ug/dL (ref 250–450)
UIBC: 244 ug/dL

## 2018-12-01 LAB — FERRITIN: Ferritin: 94 ng/mL (ref 11–307)

## 2018-12-01 NOTE — Progress Notes (Signed)
Patient stated that she had been doing well with no complaints. 

## 2019-06-02 ENCOUNTER — Inpatient Hospital Stay: Payer: BLUE CROSS/BLUE SHIELD

## 2019-06-08 ENCOUNTER — Inpatient Hospital Stay: Payer: BLUE CROSS/BLUE SHIELD | Admitting: Oncology

## 2019-06-08 ENCOUNTER — Inpatient Hospital Stay: Payer: BLUE CROSS/BLUE SHIELD

## 2019-06-24 ENCOUNTER — Other Ambulatory Visit: Payer: BLUE CROSS/BLUE SHIELD

## 2019-07-27 ENCOUNTER — Other Ambulatory Visit: Payer: Self-pay

## 2019-07-27 ENCOUNTER — Encounter: Payer: Self-pay | Admitting: Family Medicine

## 2019-07-27 ENCOUNTER — Ambulatory Visit (INDEPENDENT_AMBULATORY_CARE_PROVIDER_SITE_OTHER): Payer: BLUE CROSS/BLUE SHIELD | Admitting: Family Medicine

## 2019-07-27 VITALS — BP 134/82 | HR 92 | Temp 97.5°F | Resp 18 | Ht 63.0 in | Wt 332.0 lb

## 2019-07-27 DIAGNOSIS — D472 Monoclonal gammopathy: Secondary | ICD-10-CM

## 2019-07-27 DIAGNOSIS — Z1212 Encounter for screening for malignant neoplasm of rectum: Secondary | ICD-10-CM

## 2019-07-27 DIAGNOSIS — I10 Essential (primary) hypertension: Secondary | ICD-10-CM

## 2019-07-27 DIAGNOSIS — E876 Hypokalemia: Secondary | ICD-10-CM

## 2019-07-27 DIAGNOSIS — D500 Iron deficiency anemia secondary to blood loss (chronic): Secondary | ICD-10-CM | POA: Diagnosis not present

## 2019-07-27 DIAGNOSIS — E7800 Pure hypercholesterolemia, unspecified: Secondary | ICD-10-CM

## 2019-07-27 DIAGNOSIS — R7301 Impaired fasting glucose: Secondary | ICD-10-CM

## 2019-07-27 DIAGNOSIS — Z1211 Encounter for screening for malignant neoplasm of colon: Secondary | ICD-10-CM

## 2019-07-27 DIAGNOSIS — K219 Gastro-esophageal reflux disease without esophagitis: Secondary | ICD-10-CM

## 2019-07-27 MED ORDER — HYDROCHLOROTHIAZIDE 25 MG PO TABS: 25.0000 mg | ORAL_TABLET | Freq: Every day | ORAL | 1 refills | Status: DC

## 2019-07-27 MED ORDER — FAMOTIDINE 40 MG PO TABS: 40.0000 mg | ORAL_TABLET | Freq: Every day | ORAL | 1 refills | Status: DC

## 2019-07-27 NOTE — Progress Notes (Signed)
Name: Barbara Juarez   MRN: [id number]    DOB: 07/12/1970   Date:07/27/2019       Progress Note  Subjective  Chief Complaint  Chief Complaint  Patient presents with  . Follow-up  . Hypertension    HPI  Anemia secondary to Menorrhagia/Fibroid Uterus s/p hysterectomy: She hadhospital admission w/ blood transfusion 05/14/2018.She had total hysterectomy 09/11/2018 and had smooth recovery. No longer haing iron infusions. Taking iron supplement - down to 1 tablet once daily.  Needs to schedule 6 month follow up with Dr. Grayland Ormond.  Has appt with GYN next week for routine follow up.  HTN: She is prescribed 25mg  HCTZ.  She is doing well on this medications. Denies chest pain, no shortness of breath, headaches, palpitations.  Has occasional BLE edema - dependent - occurs by the end of the day, gone with elevation overnight. DASH diet discussed in detail. Recommend compression stockings, she has trouble finding OTC that fit, we will provide Rx for measurement and fitting today.  Obesity/Elevated LDL: She is walking regularly, cooks dinner at home. 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 Lipids showed elevated LDL and low HDL, normal TSH.  We will recheck CMP, LDL, and check A1C as she did have mild IFG at last glucose check. Denies polyphagia, polydipsia; does have some mild polyuria - notes drinking a lot more water lately.   GERD: Taking Pepcid - taking every other day and is doing very well on this. No regurgitation, no difficulty swallowing, no stomach upset.  She avoids red sauces.  Patient Active Problem List   Diagnosis Date Noted  . Elevated LDL cholesterol level 10/19/2018  . Gastroesophageal reflux disease without esophagitis 10/19/2018  . Essential hypertension 10/19/2018  . Morbid obesity ([place]) 10/19/2018  . Cervical lesion 05/20/2018  . Hypokalemia 05/14/2018  . Monoclonal gammopathy of unknown significance (MGUS) 05/27/2016  . Iron deficiency anemia due to  chronic blood loss 02/08/2015    Past Surgical History:  Procedure Laterality Date  . CESAREAN SECTION  1999  . CESAREAN SECTION  07/1991  . DILATATION & CURETTAGE/HYSTEROSCOPY WITH MYOSURE N/A 05/21/2018   Procedure: DILATATION & CURETTAGE/HYSTEROSCOPY WITH MYOSURE ENDO CERVICAL POLYPECTOMY;  Surgeon: Will Bonnet, MD;  Location: [facility] ORS;  Service: Gynecology;  Laterality: N/A;    Family History  Problem Relation Age of Onset  . Diabetes Mother   . Breast cancer Neg Hx     Social History   Socioeconomic History  . Marital status: Married    Spouse name: Surveyor, mining  . Number of children: 2  . Years of education: Not on file  . Highest education level: Not on file  Occupational History  . Not on file  Tobacco Use  . Smoking status: Never Smoker  . Smokeless tobacco: Never Used  Substance and Sexual Activity  . Alcohol use: Yes    Alcohol/week: 1.0 standard drinks    Types: 1 Glasses of wine per week    Comment: once a month  . Drug use: No  . Sexual activity: Yes    Partners: Male    Birth control/protection: Surgical  Other Topics Concern  . Not on file  Social History Narrative  . Not on file   Social Determinants of Health   Financial Resource Strain:   . Difficulty of Paying Living Expenses: Not on file  Food Insecurity:   . Worried About Charity fundraiser in the Last Year: Not on file  . Ran Out of  Food in the Last Year: Not on file  Transportation Needs:   . Lack of Transportation (Medical): Not on file  . Lack of Transportation (Non-Medical): Not on file  Physical Activity:   . Days of Exercise per Week: Not on file  . Minutes of Exercise per Session: Not on file  Stress:   . Feeling of Stress : Not on file  Social Connections:   . Frequency of Communication with Friends and Family: Not on file  . Frequency of Social Gatherings with Friends and Family: Not on file  . Attends Religious Services: Not on file  . Active Member of Clubs or  Organizations: Not on file  . Attends Archivist Meetings: Not on file  . Marital Status: Not on file  Intimate Partner Violence:   . Fear of Current or Ex-Partner: Not on file  . Emotionally Abused: Not on file  . Physically Abused: Not on file  . Sexually Abused: Not on file     Current Outpatient Medications:  .  famotidine (PEPCID) 40 MG tablet, Take 1 tablet (40 mg total) by mouth daily., Disp: 90 tablet, Rfl: 1 .  ferrous sulfate 325 (65 FE) MG EC tablet, Take 325 mg by mouth 3 (three) times daily with meals., Disp: , Rfl:  .  hydrochlorothiazide (HYDRODIURIL) 25 MG tablet, Take 1 tablet (25 mg total) by mouth daily., Disp: 90 tablet, Rfl: 1  No Known Allergies  I personally reviewed active problem list, medication list, allergies, health maintenance, notes from last encounter, lab results with the patient/caregiver today.   ROS  Ten systems reviewed and is negative except as mentioned in HPI  Objective  Vitals:   07/27/19 1010  BP: 134/82  Pulse: 92  Resp: 18  Temp: (!) 97.5 F (36.4 C)  TempSrc: Temporal  SpO2: 99%  Weight: (!) 332 lb (150.6 kg)  Height: 5\' 3"  (1.6 m)    Body mass index is 58.81 kg/m.  Physical Exam  Constitutional: Patient appears well-developed and well-nourished. No distress.  HENT: Head: Normocephalic and atraumatic.  Eyes: Conjunctivae and EOM are normal. No scleral icterus.  Neck: Normal range of motion. Neck supple. No JVD present.  Cardiovascular: Normal rate, regular rhythm and normal heart sounds.  No murmur heard. No BLE edema. Pulmonary/Chest: Effort normal and breath sounds normal. No respiratory distress. Musculoskeletal: Normal range of motion, no joint effusions. No gross deformities Neurological: Pt is alert and oriented to person, place, and time. No cranial nerve deficit. Coordination, balance, strength, speech and gait are normal.  Skin: Skin is warm and dry. No rash noted. No erythema.  Psychiatric: Patient  has a normal mood and affect. behavior is normal. Judgment and thought content normal.  No results found for this or any previous visit (from the past 72 hour(s)).  PHQ2/9: Depression screen [facility] 2/9 07/27/2019 10/19/2018 07/16/2018  Decreased Interest 0 0 0  Down, Depressed, Hopeless 0 0 0  PHQ - 2 Score 0 0 0  Altered sleeping 0 3 -  Tired, decreased energy 0 3 -  Change in appetite 0 0 -  Feeling bad or failure about yourself  0 0 -  Trouble concentrating 0 0 -  Moving slowly or fidgety/restless 0 0 -  Suicidal thoughts 0 0 -  PHQ-9 Score 0 6 -  Difficult doing work/chores - Not difficult at all -   PHQ-2/9 Result is negative.    Fall Risk: Fall Risk  07/27/2019  Falls in the past year? 0  Number falls in past yr: 0  Injury with Fall? 0  Follow up Falls evaluation completed    Functional Status Survey: Is the patient deaf or have difficulty hearing?: No Does the patient have difficulty seeing, even when wearing glasses/contacts?: No Does the patient have difficulty concentrating, remembering, or making decisions?: No Does the patient have difficulty walking or climbing stairs?: No Does the patient have difficulty dressing or bathing?: No Does the patient have difficulty doing errands alone such as visiting a doctor's office or shopping?: No   Assessment & Plan  1. Essential hypertension - DASH Diet - hydrochlorothiazide (HYDRODIURIL) 25 MG tablet; Take 1 tablet (25 mg total) by mouth daily.  Dispense: 90 tablet; Refill: 1 - COMPLETE METABOLIC PANEL WITH GFR  2. Gastroesophageal reflux disease without esophagitis - Avoid triggers - famotidine (PEPCID) 40 MG tablet; Take 1 tablet (40 mg total) by mouth daily.  Dispense: 90 tablet; Refill: 1  3. Iron deficiency anemia due to chronic blood loss - Needs to schedule with Hematology - Iron, TIBC and Ferritin Panel - CBC with Differential/Platelet  4. Monoclonal gammopathy of unknown significance (MGUS) - Needs to follow  up with hematology  5. Hypokalemia - COMPLETE METABOLIC PANEL WITH GFR  6. Elevated LDL cholesterol level - Lipid panel  7. Morbid obesity ([place]) - Discussed with the patient the risk posed by an increased BMI. Discussed importance of portion control, calorie counting and at least 150 minutes of physical activity weekly. Avoid sweet beverages and drink more water. Eat at least 6 servings of fruit and vegetables daily  - Hemoglobin A1c  8. IFG (impaired fasting glucose) - Hemoglobin A1c - COMPLETE METABOLIC PANEL WITH GFR  9. Encounter for colorectal cancer screening - Ambulatory referral to Gastroenterology

## 2019-07-28 ENCOUNTER — Encounter: Payer: Self-pay | Admitting: Family Medicine

## 2019-07-28 ENCOUNTER — Other Ambulatory Visit: Payer: Self-pay | Admitting: Family Medicine

## 2019-07-28 DIAGNOSIS — E785 Hyperlipidemia, unspecified: Secondary | ICD-10-CM | POA: Insufficient documentation

## 2019-07-28 DIAGNOSIS — Z1212 Encounter for screening for malignant neoplasm of rectum: Secondary | ICD-10-CM

## 2019-07-28 DIAGNOSIS — E1169 Type 2 diabetes mellitus with other specified complication: Secondary | ICD-10-CM | POA: Insufficient documentation

## 2019-07-28 DIAGNOSIS — Z1211 Encounter for screening for malignant neoplasm of colon: Secondary | ICD-10-CM

## 2019-07-28 DIAGNOSIS — E1165 Type 2 diabetes mellitus with hyperglycemia: Secondary | ICD-10-CM

## 2019-07-28 DIAGNOSIS — E119 Type 2 diabetes mellitus without complications: Secondary | ICD-10-CM | POA: Insufficient documentation

## 2019-07-28 LAB — COMPLETE METABOLIC PANEL WITH GFR
AG Ratio: 1.1 (calc) (ref 1.0–2.5)
ALT: 18 U/L (ref 6–29)
AST: 18 U/L (ref 10–35)
Albumin: 4 g/dL (ref 3.6–5.1)
Alkaline phosphatase (APISO): 89 U/L (ref 31–125)
BUN: 12 mg/dL (ref 7–25)
CO2: 30 mmol/L (ref 20–32)
Calcium: 9.4 mg/dL (ref 8.6–10.2)
Chloride: 96 mmol/L — ABNORMAL LOW (ref 98–110)
Creat: 0.75 mg/dL (ref 0.50–1.10)
GFR, Est African American: 111 mL/min/{1.73_m2} (ref >=60)
GFR, Est Non African American: 96 mL/min/{1.73_m2} (ref >=60)
Globulin: 3.5 g/dL (calc) (ref 1.9–3.7)
Glucose, Bld: 171 mg/dL — ABNORMAL HIGH (ref 65–99)
Potassium: 3.6 mmol/L (ref 3.5–5.3)
Sodium: 136 mmol/L (ref 135–146)
Total Bilirubin: 0.3 mg/dL (ref 0.2–1.2)
Total Protein: 7.5 g/dL (ref 6.1–8.1)

## 2019-07-28 LAB — LIPID PANEL
Cholesterol: 210 mg/dL — ABNORMAL HIGH (ref <200)
HDL: 42 mg/dL — ABNORMAL LOW (ref >=50)
LDL Cholesterol (Calc): 136 mg/dL (calc) — ABNORMAL HIGH
Non-HDL Cholesterol (Calc): 168 mg/dL (calc) — ABNORMAL HIGH (ref <130)
Total CHOL/HDL Ratio: 5 (calc) — ABNORMAL HIGH (ref <5.0)
Triglycerides: 185 mg/dL — ABNORMAL HIGH (ref <150)

## 2019-07-28 LAB — CBC WITH DIFFERENTIAL/PLATELET
Absolute Monocytes: 432 cells/uL (ref 200–950)
Basophils Absolute: 58 cells/uL (ref 0–200)
Basophils Relative: 0.7 %
Eosinophils Absolute: 299 cells/uL (ref 15–500)
Eosinophils Relative: 3.6 %
HCT: 39.5 % (ref 35.0–45.0)
Hemoglobin: 13.1 g/dL (ref 11.7–15.5)
Lymphs Abs: 2283 cells/uL (ref 850–3900)
MCH: 28.7 pg (ref 27.0–33.0)
MCHC: 33.2 g/dL (ref 32.0–36.0)
MCV: 86.6 fL (ref 80.0–100.0)
MPV: 9.8 fL (ref 7.5–12.5)
Monocytes Relative: 5.2 %
Neutro Abs: 5229 cells/uL (ref 1500–7800)
Neutrophils Relative %: 63 %
Platelets: 383 10*3/uL (ref 140–400)
RBC: 4.56 10*6/uL (ref 3.80–5.10)
RDW: 13 % (ref 11.0–15.0)
Total Lymphocyte: 27.5 %
WBC: 8.3 10*3/uL (ref 3.8–10.8)

## 2019-07-28 LAB — HEMOGLOBIN A1C
Hgb A1c MFr Bld: 7.4 % of total Hgb — ABNORMAL HIGH (ref <5.7)
Mean Plasma Glucose: 166 (calc)
eAG (mmol/L): 9.2 (calc)

## 2019-07-28 LAB — IRON,TIBC AND FERRITIN PANEL
%SAT: 20 % (calc) (ref 16–45)
Ferritin: 141 ng/mL (ref 16–232)
Iron: 54 ug/dL (ref 40–190)
TIBC: 271 mcg/dL (calc) (ref 250–450)

## 2019-07-28 MED ORDER — METFORMIN HCL 500 MG PO TABS: ORAL_TABLET | ORAL | 1 refills | Status: DC

## 2019-07-28 MED ORDER — ATORVASTATIN CALCIUM 20 MG PO TABS: 20.0000 mg | ORAL_TABLET | Freq: Every day | ORAL | 3 refills | Status: DC

## 2019-07-30 ENCOUNTER — Encounter: Payer: Self-pay | Admitting: Family Medicine

## 2019-07-30 DIAGNOSIS — E1169 Type 2 diabetes mellitus with other specified complication: Secondary | ICD-10-CM

## 2019-08-02 MED ORDER — BLOOD GLUCOSE MONITOR KIT: PACK | 0 refills | Status: DC

## 2019-08-04 ENCOUNTER — Encounter: Payer: Self-pay | Admitting: Family Medicine

## 2019-08-04 DIAGNOSIS — E1169 Type 2 diabetes mellitus with other specified complication: Secondary | ICD-10-CM

## 2019-08-05 MED ORDER — BLOOD GLUCOSE MONITOR KIT: PACK | 0 refills | Status: DC

## 2019-08-09 ENCOUNTER — Other Ambulatory Visit: Payer: Self-pay | Admitting: Emergency Medicine

## 2019-08-09 ENCOUNTER — Encounter: Payer: Self-pay | Admitting: Family Medicine

## 2019-08-09 DIAGNOSIS — E1169 Type 2 diabetes mellitus with other specified complication: Secondary | ICD-10-CM

## 2019-08-10 MED ORDER — ACCU-CHEK MULTICLIX LANCETS MISC: 12 refills | Status: DC

## 2019-08-10 MED ORDER — BLOOD GLUCOSE MONITOR KIT: PACK | 0 refills | Status: DC

## 2019-08-10 MED ORDER — GLUCOSE BLOOD VI STRP: ORAL_STRIP | 12 refills | Status: DC

## 2019-08-10 NOTE — Progress Notes (Signed)
Bonnita Nasuti - I signed off on pended orders, however I have sent this order electronically now 3 times.  Please call to ensure they have received.

## 2019-08-11 ENCOUNTER — Encounter: Payer: Self-pay | Admitting: Family Medicine

## 2019-08-18 NOTE — Telephone Encounter (Signed)
Noted  

## 2019-08-24 ENCOUNTER — Telehealth: Payer: Self-pay | Admitting: Emergency Medicine

## 2019-08-24 DIAGNOSIS — E1169 Type 2 diabetes mellitus with other specified complication: Secondary | ICD-10-CM

## 2019-08-24 MED ORDER — ACCU-CHEK MULTICLIX LANCETS MISC: 12 refills | Status: AC

## 2019-08-24 MED ORDER — BLOOD GLUCOSE MONITOR KIT: PACK | 0 refills | Status: AC

## 2019-08-24 MED ORDER — GLUCOSE BLOOD VI STRP: ORAL_STRIP | 12 refills | Status: AC

## 2019-08-25 ENCOUNTER — Ambulatory Visit
Admission: RE | Admit: 2019-08-25 | Discharge: 2019-08-25 | Disposition: A | Discharge status: Home / Self Care | Payer: BLUE CROSS/BLUE SHIELD | Source: Ambulatory Visit | Attending: Ophthalmology | Admitting: Ophthalmology

## 2019-08-25 ENCOUNTER — Other Ambulatory Visit
Admission: RE | Admit: 2019-08-25 | Discharge: 2019-08-25 | Disposition: A | Discharge status: Home / Self Care | Payer: BLUE CROSS/BLUE SHIELD | Source: Ambulatory Visit | Attending: Ophthalmology | Admitting: Ophthalmology

## 2019-08-25 DIAGNOSIS — H209 Unspecified iridocyclitis: Secondary | ICD-10-CM

## 2019-08-25 LAB — CBC WITH DIFFERENTIAL/PLATELET
Abs Immature Granulocytes: 0.04 10*3/uL (ref 0.00–0.07)
Basophils Absolute: 0.1 10*3/uL (ref 0.0–0.1)
Basophils Relative: 1 %
Eosinophils Absolute: 0.3 10*3/uL (ref 0.0–0.5)
Eosinophils Relative: 3 %
HCT: 39.4 % (ref 36.0–46.0)
Hemoglobin: 13.1 g/dL (ref 12.0–15.0)
Immature Granulocytes: 0 %
Lymphocytes Relative: 29 %
Lymphs Abs: 3.1 10*3/uL (ref 0.7–4.0)
MCH: 29 pg (ref 26.0–34.0)
MCHC: 33.2 g/dL (ref 30.0–36.0)
MCV: 87.4 fL (ref 80.0–100.0)
Monocytes Absolute: 0.6 10*3/uL (ref 0.1–1.0)
Monocytes Relative: 5 %
Neutro Abs: 6.8 10*3/uL (ref 1.7–7.7)
Neutrophils Relative %: 62 %
Platelets: 420 10*3/uL — ABNORMAL HIGH (ref 150–400)
RBC: 4.51 MIL/uL (ref 3.87–5.11)
RDW: 13.6 % (ref 11.5–15.5)
WBC: 10.9 10*3/uL — ABNORMAL HIGH (ref 4.0–10.5)
nRBC: 0 % (ref 0.0–0.2)

## 2019-08-25 LAB — BASIC METABOLIC PANEL
Anion gap: 12 (ref 5–15)
BUN: 15 mg/dL (ref 6–20)
CO2: 27 mmol/L (ref 22–32)
Calcium: 9.3 mg/dL (ref 8.9–10.3)
Chloride: 97 mmol/L — ABNORMAL LOW (ref 98–111)
Creatinine, Ser: 0.71 mg/dL (ref 0.44–1.00)
GFR calc Af Amer: >60 mL/min (ref >60)
GFR calc non Af Amer: >60 mL/min (ref >60)
Glucose, Bld: 113 mg/dL — ABNORMAL HIGH (ref 70–99)
Potassium: 3.3 mmol/L — ABNORMAL LOW (ref 3.5–5.1)
Sodium: 136 mmol/L (ref 135–145)

## 2019-08-25 NOTE — Telephone Encounter (Signed)
Pharmacy called stating that they need specific instructions for all medications and that it cant be use as instructed. Please advise.     [place], [address]  [place] [address]  Phone: [phone] Fax: [phone]  Not a 24 hour pharmacy; exact hours not known.

## 2019-08-25 NOTE — Telephone Encounter (Signed)
Pend script with directions to be sent again to [facility]

## 2019-08-26 LAB — RPR
RPR Ser Ql: REACTIVE — AB
RPR Titer: 1:1 {titer}

## 2019-08-26 LAB — ANGIOTENSIN CONVERTING ENZYME: Angiotensin-Converting Enzyme: 23 U/L (ref 14–82)

## 2019-08-27 LAB — PAN-ANCA
ANCA Proteinase 3: <3.5 U/mL (ref 0.0–3.5)
Atypical P-ANCA titer: <1:20 {titer}
C-ANCA: <1:20 {titer}
Myeloperoxidase Abs: <9 U/mL (ref 0.0–9.0)
P-ANCA: <1:20 {titer}

## 2019-08-27 LAB — T.PALLIDUM AB, TOTAL: T Pallidum Abs: NONREACTIVE

## 2019-08-28 LAB — QUANTIFERON-TB GOLD PLUS (RQFGPL)
QuantiFERON Mitogen Value: >10 IU/mL
QuantiFERON Nil Value: 0.02 IU/mL
QuantiFERON TB1 Ag Value: 0.04 IU/mL
QuantiFERON TB2 Ag Value: 0.05 IU/mL

## 2019-08-28 LAB — QUANTIFERON-TB GOLD PLUS: QuantiFERON-TB Gold Plus: NEGATIVE

## 2019-08-31 LAB — HLA-B27 ANTIGEN: HLA-B27: NEGATIVE

## 2019-09-01 ENCOUNTER — Ambulatory Visit (INDEPENDENT_AMBULATORY_CARE_PROVIDER_SITE_OTHER): Payer: BLUE CROSS/BLUE SHIELD | Admitting: Internal Medicine

## 2019-09-01 ENCOUNTER — Encounter: Payer: Self-pay | Admitting: Internal Medicine

## 2019-09-01 ENCOUNTER — Other Ambulatory Visit: Payer: Self-pay

## 2019-09-01 VITALS — BP 130/86 | HR 105 | Temp 97.2°F | Resp 20 | Ht 63.0 in | Wt 319.6 lb

## 2019-09-01 DIAGNOSIS — I10 Essential (primary) hypertension: Secondary | ICD-10-CM

## 2019-09-01 DIAGNOSIS — E1169 Type 2 diabetes mellitus with other specified complication: Secondary | ICD-10-CM | POA: Diagnosis not present

## 2019-09-01 DIAGNOSIS — R768 Other specified abnormal immunological findings in serum: Secondary | ICD-10-CM | POA: Diagnosis not present

## 2019-09-01 NOTE — Progress Notes (Signed)
Patient ID: Barbara Juarez, female    DOB: Jul 31, 1970, 49 y.o.   MRN: [id number]  PCP: Hubbard Hartshorn, FNP  Chief Complaint  Patient presents with  . Lab review    test from 2/24 showed reactive to Syphillis, patient is concerned and wants to understand why it would be positive.     Subjective:   Barbara Juarez is a 49 y.o. female, presents to clinic with CC of the following:  Chief Complaint  Patient presents with  . Lab review    test from 2/24 showed reactive to Syphillis, patient is concerned and wants to understand why it would be positive.     HPI:  Patient is a 49 year old female who last saw Raelyn Ensign in January 2021 and follows with Raquel Sarna.  Labs obtained then. Follows up today with concerns for a recent test done, and RPR, which was reactive.  Patient notes she has been seeing an eye doctor for over the past year, and had blood work ordered including a chest x-ray a while back.  She had delays in getting these done, but finally was able to do them on 08/25/2019.  As part of that blood work, an RPR was done which was reactive at 1:1 titer.  She was notified by her eye doctor at approximately 5:00 last evening that she had syphilis.  She was very upset and in a panic with that notification.  And that prompted the follow-up today.  She denied any history of syphilis, denied any history of any STD concerns in her past.  She is currently married and monogamous.  She had an HIV test done in 05/2018 which was negative. She does have a history of anemia secondary to menorrhagia/fibroid and is status post a hysterectomy.  She did have a blood transfusion on 05/14/2018, and had iron infusions in her past, the last in February or March 2020.  She is followed by Dr. Grayland Ormond for this.  Does have a h/o HTN: BP good today   BP Readings from Last 3 Encounters:  09/01/19 130/86  07/27/19 134/82  12/01/18 140/90   Also more recently diagnosed with diabetes -on most recent lab  check, her A1c was over 7.  She has been trying to watch her diet more to help.    Lab Results  Component Value Date   HGBA1C 7.4 (H) 07/27/2019      Patient Active Problem List   Diagnosis Date Noted  . Hyperlipidemia associated with type 2 diabetes mellitus ([place]) 07/28/2019  . Type 2 diabetes mellitus ([place]) 07/28/2019  . Elevated LDL cholesterol level 10/19/2018  . Gastroesophageal reflux disease without esophagitis 10/19/2018  . Essential hypertension 10/19/2018  . Morbid obesity ([place]) 10/19/2018  . Cervical lesion 05/20/2018  . Hypokalemia 05/14/2018  . Monoclonal gammopathy of unknown significance (MGUS) 05/27/2016  . Iron deficiency anemia due to chronic blood loss 02/08/2015      Current Outpatient Medications:  .  atorvastatin (LIPITOR) 20 MG tablet, Take 1 tablet (20 mg total) by mouth daily., Disp: 90 tablet, Rfl: 3 .  blood glucose meter kit and supplies KIT, Dispense based on patient and insurance preference. Check fasting blood sugar once daily as directed., Disp: 1 each, Rfl: 0 .  famotidine (PEPCID) 40 MG tablet, Take 1 tablet (40 mg total) by mouth daily., Disp: 90 tablet, Rfl: 1 .  ferrous sulfate 325 (65 FE) MG EC tablet, Take 325 mg by mouth 3 (three) times daily with meals., Disp: ,  Rfl:  .  glucose blood test strip, Use as instructed, Disp: 100 each, Rfl: 12 .  hydrochlorothiazide (HYDRODIURIL) 25 MG tablet, Take 1 tablet (25 mg total) by mouth daily., Disp: 90 tablet, Rfl: 1 .  Lancets (ACCU-CHEK MULTICLIX) lancets, Use as instructed, Disp: 100 each, Rfl: 12 .  metFORMIN (GLUCOPHAGE) 500 MG tablet, Take 1 tablet (500 mg total) by mouth daily with breakfast for 7 days, THEN 1 tablet (500 mg total) 2 (two) times daily with a meal., Disp: 180 tablet, Rfl: 1   No Known Allergies   Past Surgical History:  Procedure Laterality Date  . ABDOMINAL HYSTERECTOMY  09/11/2018  . CESAREAN SECTION  1999  . CESAREAN SECTION  07/1991  . DILATATION &  CURETTAGE/HYSTEROSCOPY WITH MYOSURE N/A 05/21/2018   Procedure: DILATATION & CURETTAGE/HYSTEROSCOPY WITH MYOSURE ENDO CERVICAL POLYPECTOMY;  Surgeon: Will Bonnet, MD;  Location: [facility] ORS;  Service: Gynecology;  Laterality: N/A;     Family History  Problem Relation Age of Onset  . Diabetes Mother   . Breast cancer Neg Hx      Social History   Tobacco Use  . Smoking status: Never Smoker  . Smokeless tobacco: Never Used  Substance Use Topics  . Alcohol use: Yes    Alcohol/week: 1.0 standard drinks    Types: 1 Glasses of wine per week    Comment: once a month    With staff assistance, above reviewed with the patient today.  ROS: As per HPI, otherwise no specific complaints on a limited and focused system review   PHQ2/9: Depression screen [facility] 2/9 09/01/2019 07/27/2019 10/19/2018 07/16/2018  Decreased Interest 0 0 0 0  Down, Depressed, Hopeless 0 0 0 0  PHQ - 2 Score 0 0 0 0  Altered sleeping 1 0 3 -  Tired, decreased energy 0 0 3 -  Change in appetite 0 0 0 -  Feeling bad or failure about yourself  0 0 0 -  Trouble concentrating 0 0 0 -  Moving slowly or fidgety/restless 0 0 0 -  Suicidal thoughts 0 0 0 -  PHQ-9 Score 1 0 6 -  Difficult doing work/chores Not difficult at all - Not difficult at all -   PHQ-2/9 Result is neg  Fall Risk: Fall Risk  09/01/2019 07/27/2019  Falls in the past year? 0 0  Number falls in past yr: 0 0  Injury with Fall? 0 0  Follow up - Falls evaluation completed      Objective:   Vitals:   09/01/19 0833  BP: 130/86  Pulse: (!) 105  Resp: 20  Temp: (!) 97.2 F (36.2 C)  TempSrc: Temporal  SpO2: 97%  Weight: (!) 319 lb 9.6 oz (145 kg)  Height: '5\' 3"'$  (1.6 m)    Body mass index is 56.61 kg/m.  Physical Exam   NAD, masked, pleasant, obese HEENT - sclera anicteric Neuro/psychiatric - affect was not flat, appropriate with conversation  Alert and oriented  Speech and gait are normal   Her recent CXR was read as normal exam    Results for orders placed or performed during the hospital encounter of 08/25/19  CBC with Differential/Platelet  Result Value Ref Range   WBC 10.9 (H) 4.0 - 10.5 K/uL   RBC 4.51 3.87 - 5.11 MIL/uL   Hemoglobin 13.1 12.0 - 15.0 g/dL   HCT 39.4 36.0 - 46.0 %   MCV 87.4 80.0 - 100.0 fL   MCH 29.0 26.0 - 34.0 pg  MCHC 33.2 30.0 - 36.0 g/dL   RDW 13.6 11.5 - 15.5 %   Platelets 420 (H) 150 - 400 K/uL   nRBC 0.0 0.0 - 0.2 %   Neutrophils Relative % 62 %   Neutro Abs 6.8 1.7 - 7.7 K/uL   Lymphocytes Relative 29 %   Lymphs Abs 3.1 0.7 - 4.0 K/uL   Monocytes Relative 5 %   Monocytes Absolute 0.6 0.1 - 1.0 K/uL   Eosinophils Relative 3 %   Eosinophils Absolute 0.3 0.0 - 0.5 K/uL   Basophils Relative 1 %   Basophils Absolute 0.1 0.0 - 0.1 K/uL   Immature Granulocytes 0 %   Abs Immature Granulocytes 0.04 0.00 - 0.07 K/uL  Basic metabolic panel  Result Value Ref Range   Sodium 136 135 - 145 mmol/L   Potassium 3.3 (L) 3.5 - 5.1 mmol/L   Chloride 97 (L) 98 - 111 mmol/L   CO2 27 22 - 32 mmol/L   Glucose, Bld 113 (H) 70 - 99 mg/dL   BUN 15 6 - 20 mg/dL   Creatinine, Ser 0.71 0.44 - 1.00 mg/dL   Calcium 9.3 8.9 - 10.3 mg/dL   GFR calc non Af Amer >60 >60 mL/min   GFR calc Af Amer >60 >60 mL/min   Anion gap 12 5 - 15  QuantiFERON-TB Gold Plus  Result Value Ref Range   QuantiFERON Incubation Incubation performed.    QuantiFERON-TB Gold Plus Negative Negative  RPR  Result Value Ref Range   RPR Ser Ql Reactive (A) NON REACTIVE   RPR Titer 1:1   Angiotensin converting enzyme  Result Value Ref Range   Angiotensin-Converting Enzyme 23 14 - 82 U/L  HLA-B27 antigen  Result Value Ref Range   HLA-B27 Negative   Pan-ANCA  Result Value Ref Range   Myeloperoxidase Abs <9.0 0.0 - 9.0 U/mL   ANCA Proteinase 3 <3.5 0.0 - 3.5 U/mL   C-ANCA <1:20 Neg:<1:20 titer   P-ANCA <1:20 Neg:<1:20 titer   Atypical P-ANCA titer <1:20 Neg:<1:20 titer  T.pallidum Ab, Total  Result Value Ref Range   T  Pallidum Abs Non Reactive Non Reactive  QuantiFERON-TB Gold Plus  Result Value Ref Range   QuantiFERON Criteria Comment    QuantiFERON TB1 Ag Value 0.04 IU/mL   QuantiFERON TB2 Ag Value 0.05 IU/mL   QuantiFERON Nil Value 0.02 IU/mL   QuantiFERON Mitogen Value >10.00 IU/mL       Assessment & Plan:   1. Biological false positive RPR test Educated patient on the screening test for syphilis, the RPR and it being a nontreponemal test.  Her test was reactive, but at a low titer at 1:1.  This is a screening test, and often than a reflex test is done which is more specific for syphilis, and that was done in her case.  With Melissa's help, we did call the lab to confirm that reflex test completion and the result.  That test, the T.  Pallidum antibody was nonreactive.  Thus, her RPR was a false positive and she does not have testing consistent with syphilis.  She was relieved when I shared that information with her. She does not need further work-up or testing for this presently.  2. Type 2 diabetes mellitus with other specified complication, without long-term current use of insulin ([place]) She was recently diagnosed with diabetes, and did encourage diet modifications as she is trying to do presently to help.  She has a planned follow-up with Raquel Sarna approximately 3  months after her last visit which was in January 2021 and keeping up follow-up is important.  Continue current management.  3. HTN  Currently on low-dose hydrochlorothiazide, and her blood pressure today was good.  Follow-up as planned 3 months after her January visit, and follow-up sooner as needed.    Towanda Malkin, MD 09/01/19 8:55 AM

## 2019-09-07 ENCOUNTER — Other Ambulatory Visit: Payer: Self-pay | Admitting: Emergency Medicine

## 2019-09-07 DIAGNOSIS — D500 Iron deficiency anemia secondary to blood loss (chronic): Secondary | ICD-10-CM

## 2019-09-08 ENCOUNTER — Other Ambulatory Visit: Payer: Self-pay

## 2019-09-08 ENCOUNTER — Inpatient Hospital Stay: Payer: BLUE CROSS/BLUE SHIELD | Attending: Oncology

## 2019-09-08 DIAGNOSIS — Z833 Family history of diabetes mellitus: Secondary | ICD-10-CM | POA: Insufficient documentation

## 2019-09-08 DIAGNOSIS — D472 Monoclonal gammopathy: Secondary | ICD-10-CM | POA: Diagnosis present

## 2019-09-08 DIAGNOSIS — D509 Iron deficiency anemia, unspecified: Secondary | ICD-10-CM | POA: Diagnosis present

## 2019-09-08 DIAGNOSIS — Z79899 Other long term (current) drug therapy: Secondary | ICD-10-CM | POA: Insufficient documentation

## 2019-09-08 DIAGNOSIS — I10 Essential (primary) hypertension: Secondary | ICD-10-CM | POA: Insufficient documentation

## 2019-09-08 DIAGNOSIS — Z90710 Acquired absence of both cervix and uterus: Secondary | ICD-10-CM | POA: Insufficient documentation

## 2019-09-08 DIAGNOSIS — Z7984 Long term (current) use of oral hypoglycemic drugs: Secondary | ICD-10-CM | POA: Insufficient documentation

## 2019-09-08 DIAGNOSIS — D500 Iron deficiency anemia secondary to blood loss (chronic): Secondary | ICD-10-CM

## 2019-09-08 LAB — CBC WITH DIFFERENTIAL/PLATELET
Abs Immature Granulocytes: 0.05 10*3/uL (ref 0.00–0.07)
Basophils Absolute: 0.1 10*3/uL (ref 0.0–0.1)
Basophils Relative: 1 %
Eosinophils Absolute: 0.3 10*3/uL (ref 0.0–0.5)
Eosinophils Relative: 3 %
HCT: 38.4 % (ref 36.0–46.0)
Hemoglobin: 12.6 g/dL (ref 12.0–15.0)
Immature Granulocytes: 0 %
Lymphocytes Relative: 28 %
Lymphs Abs: 3.3 10*3/uL (ref 0.7–4.0)
MCH: 29 pg (ref 26.0–34.0)
MCHC: 32.8 g/dL (ref 30.0–36.0)
MCV: 88.5 fL (ref 80.0–100.0)
Monocytes Absolute: 0.5 10*3/uL (ref 0.1–1.0)
Monocytes Relative: 4 %
Neutro Abs: 7.5 10*3/uL (ref 1.7–7.7)
Neutrophils Relative %: 64 %
Platelets: 434 10*3/uL — ABNORMAL HIGH (ref 150–400)
RBC: 4.34 MIL/uL (ref 3.87–5.11)
RDW: 13.5 % (ref 11.5–15.5)
WBC: 11.8 10*3/uL — ABNORMAL HIGH (ref 4.0–10.5)
nRBC: 0 % (ref 0.0–0.2)

## 2019-09-08 LAB — IRON AND TIBC
Iron: 31 ug/dL (ref 28–170)
Saturation Ratios: 10 % — ABNORMAL LOW (ref 10.4–31.8)
TIBC: 308 ug/dL (ref 250–450)
UIBC: 277 ug/dL

## 2019-09-08 LAB — FERRITIN: Ferritin: 149 ng/mL (ref 11–307)

## 2019-09-10 NOTE — Progress Notes (Signed)
[place]  Telephone:([phone]) [phone]  Fax:([phone]) [place] DOB: 23-Nov-1970  MR#: [id number]  ONG#:[id number]  Date of service 02/22/2015.  Patient Care Team: Hubbard Hartshorn, FNP as PCP - General (Family Medicine)  CHIEF COMPLAINT:  Iron deficiency anemia, MGUS.  INTERVAL HISTORY: Patient returns to clinic today for repeat laboratory work and further evaluation.  She continues to feel well and remains asymptomatic.  She does not complain of any weakness or fatigue.  She has no neurologic complaints. She denies any recent fevers or illnesses.  She denies any chest pain, shortness of breath, cough, or hemoptysis.  She denies any nausea, vomiting, constipation, or diarrhea. She has no melena or hematochezia.  She has no urinary complaints.  Patient feels at her baseline offers no specific complaints today.  REVIEW OF SYSTEMS:   Review of Systems  Constitutional: Negative.  Negative for fever, malaise/fatigue and weight loss.  Respiratory: Negative.  Negative for cough and shortness of breath.   Cardiovascular: Negative.  Negative for chest pain and leg swelling.  Gastrointestinal: Negative.  Negative for abdominal pain, blood in stool and melena.  Genitourinary: Negative.  Negative for hematuria.  Musculoskeletal: Negative.  Negative for back pain.  Skin: Negative.  Negative for rash.  Neurological: Negative.  Negative for sensory change, focal weakness, weakness and headaches.  Psychiatric/Behavioral: Negative.  The patient is not nervous/anxious.     As per HPI. Otherwise, a complete review of systems is negative.  PAST MEDICAL HISTORY: Past Medical History:  Diagnosis Date  . Anemia   . Anemia associated with acute blood loss 05/14/2018  . Fibroid uterus 05/20/2018  . Fibroids   . History of blood transfusion   . Hypertension   . Menorrhagia with irregular cycle 05/14/2018    PAST SURGICAL HISTORY: Past Surgical History:  Procedure  Laterality Date  . ABDOMINAL HYSTERECTOMY  09/11/2018  . CESAREAN SECTION  1999  . CESAREAN SECTION  07/1991  . DILATATION & CURETTAGE/HYSTEROSCOPY WITH MYOSURE N/A 05/21/2018   Procedure: DILATATION & CURETTAGE/HYSTEROSCOPY WITH MYOSURE ENDO CERVICAL POLYPECTOMY;  Surgeon: Will Bonnet, MD;  Location: [facility] ORS;  Service: Gynecology;  Laterality: N/A;    FAMILY HISTORY Family History  Problem Relation Age of Onset  . Diabetes Mother   . Breast cancer Neg Hx     GYNECOLOGIC HISTORY:  09/07/2015    HEALTH MAINTENANCE: Social History   Tobacco Use  . Smoking status: Never Smoker  . Smokeless tobacco: Never Used  Substance Use Topics  . Alcohol use: Yes    Alcohol/week: 1.0 standard drinks    Types: 1 Glasses of wine per week    Comment: once a month  . Drug use: No    No Known Allergies  Current Outpatient Medications  Medication Sig Dispense Refill  . atorvastatin (LIPITOR) 20 MG tablet Take 1 tablet (20 mg total) by mouth daily. 90 tablet 3  . blood glucose meter kit and supplies KIT Dispense based on patient and insurance preference. Check fasting blood sugar once daily as directed. 1 each 0  . famotidine (PEPCID) 40 MG tablet Take 1 tablet (40 mg total) by mouth daily. 90 tablet 1  . ferrous sulfate 325 (65 FE) MG EC tablet Take 325 mg by mouth 3 (three) times daily with meals.    Marland Kitchen glucose blood test strip Use as instructed 100 each 12  . hydrochlorothiazide (HYDRODIURIL) 25 MG tablet Take 1 tablet (25 mg total) by mouth daily. [place]  tablet 1  . Lancets (ACCU-CHEK MULTICLIX) lancets Use as instructed 100 each 12  . metFORMIN (GLUCOPHAGE) 500 MG tablet Take 1 tablet (500 mg total) by mouth daily with breakfast for 7 days, THEN 1 tablet (500 mg total) 2 (two) times daily with a meal. 180 tablet 1   No current facility-administered medications for this visit.    OBJECTIVE: BP (!) 163/99 (BP Location: Right Arm, Patient Position: Sitting, Cuff Size: Large)   Pulse 85    Temp 97.7 F (36.5 C) (Tympanic)   Wt (!) 323 lb 9.6 oz (146.8 kg)   LMP 05/06/2018 (Approximate) Comment: neg hcg  SpO2 100%   BMI 57.32 kg/m    Body mass index is 57.32 kg/m.    ECOG FS:0 - Asymptomatic  General: Well-developed, well-nourished, no acute distress. Eyes: Pink conjunctiva, anicteric sclera. HEENT: Normocephalic, moist mucous membranes. Lungs: No audible wheezing or coughing. Heart: Regular rate and rhythm. Abdomen: Soft, nontender, no obvious distention. Musculoskeletal: No edema, cyanosis, or clubbing. Neuro: Alert, answering all questions appropriately. Cranial nerves grossly intact. Skin: No rashes or petechiae noted. Psych: Normal affect.  LAB RESULTS:  CBC    Component Value Date/Time   WBC 11.8 (H) 09/08/2019 1338   RBC 4.34 09/08/2019 1338   HGB 12.6 09/08/2019 1338   HGB 10.6 (L) 09/23/2014 0820   HCT 38.4 09/08/2019 1338   HCT 33.2 (L) 09/23/2014 0820   PLT 434 (H) 09/08/2019 1338   PLT 420 09/23/2014 0820   MCV 88.5 09/08/2019 1338   MCV 87 09/23/2014 0820   MCH 29.0 09/08/2019 1338   MCHC 32.8 09/08/2019 1338   RDW 13.5 09/08/2019 1338   RDW 16.4 (H) 09/23/2014 0820   LYMPHSABS 3.3 09/08/2019 1338   LYMPHSABS 2.2 09/23/2014 0820   MONOABS 0.5 09/08/2019 1338   MONOABS 0.5 09/23/2014 0820   EOSABS 0.3 09/08/2019 1338   EOSABS 0.4 09/23/2014 0820   BASOSABS 0.1 09/08/2019 1338   BASOSABS 0.1 09/23/2014 0820     Lab Results  Component Value Date   TOTALPROTELP 7.1 04/28/2018   ALBUMINELP 3.4 04/28/2018   A1GS 0.2 04/28/2018   A2GS 0.6 04/28/2018   BETS 1.2 04/28/2018   GAMS 1.7 04/28/2018   MSPIKE 0.8 (H) 04/28/2018   SPEI Comment 04/28/2018    Lab Results  Component Value Date   IRON 31 09/08/2019   TIBC 308 09/08/2019   IRONPCTSAT 10 (L) 09/08/2019   Lab Results  Component Value Date   FERRITIN 149 09/08/2019      STUDIES: No results found.  ASSESSMENT:  Iron deficiency anemia, MGUS.  PLAN:   1. Iron  deficiency anemia: Resolved with hysterectomy.  Patient's hemoglobin continues to be well within normal limits.  Her iron saturation is slightly low at 10%, but the rest of her iron panel is within normal limits.  Continue oral iron supplementation.  No intervention is needed at this time.  Return to clinic in 1 year with repeat laboratory work and further evaluation.   2. MGUS: Patient's most recent M spike on April 28, 2018 reported at 0.8 which is unchanged from August 2016.  Patient has an IgG prominence which is less likely to progress to overt multiple myeloma.  Her kappa/lambda light chain ratio is within normal limits.  She has no evidence of endorgan damage.  No intervention is needed at this time.  Patient does not need metastatic bone survey or bone marrow biopsy.  Recheck laboratory work in 1 year.  I spent a  total of 20 minutes reviewing chart data, face-to-face evaluation with the patient, counseling and coordination of care as detailed above.   Patient expressed understanding and was in agreement with this plan. She also understands that She can call clinic at any time with any questions, concerns, or complaints.    Lloyd Huger, MD   09/17/2019 3:26 PM

## 2019-09-16 ENCOUNTER — Other Ambulatory Visit: Payer: Self-pay

## 2019-09-16 NOTE — Progress Notes (Signed)
Patient prescreened for appointment. Patient has questions regarding RPR test results from another encounter on 2/24. Will pass on to Dr. Grayland Ormond

## 2019-09-17 ENCOUNTER — Inpatient Hospital Stay (HOSPITAL_BASED_OUTPATIENT_CLINIC_OR_DEPARTMENT_OTHER): Payer: BLUE CROSS/BLUE SHIELD | Admitting: Oncology

## 2019-09-17 ENCOUNTER — Inpatient Hospital Stay: Payer: BLUE CROSS/BLUE SHIELD

## 2019-09-17 VITALS — BP 163/99 | HR 85 | Temp 97.7°F | Wt 323.6 lb

## 2019-09-17 DIAGNOSIS — D500 Iron deficiency anemia secondary to blood loss (chronic): Secondary | ICD-10-CM | POA: Diagnosis not present

## 2019-09-17 DIAGNOSIS — D472 Monoclonal gammopathy: Secondary | ICD-10-CM | POA: Diagnosis not present

## 2019-09-17 DIAGNOSIS — D509 Iron deficiency anemia, unspecified: Secondary | ICD-10-CM | POA: Diagnosis not present

## 2019-12-31 ENCOUNTER — Other Ambulatory Visit: Payer: Self-pay

## 2019-12-31 ENCOUNTER — Ambulatory Visit: Payer: Self-pay | Admitting: Family Medicine

## 2019-12-31 DIAGNOSIS — E785 Hyperlipidemia, unspecified: Secondary | ICD-10-CM

## 2019-12-31 DIAGNOSIS — E1165 Type 2 diabetes mellitus with hyperglycemia: Secondary | ICD-10-CM

## 2019-12-31 DIAGNOSIS — I10 Essential (primary) hypertension: Secondary | ICD-10-CM

## 2019-12-31 MED ORDER — METFORMIN HCL 500 MG PO TABS: ORAL_TABLET | ORAL | 1 refills | Status: DC

## 2019-12-31 MED ORDER — ATORVASTATIN CALCIUM 20 MG PO TABS: 20.0000 mg | ORAL_TABLET | Freq: Every day | ORAL | 1 refills | Status: DC

## 2019-12-31 MED ORDER — HYDROCHLOROTHIAZIDE 25 MG PO TABS: 25.0000 mg | ORAL_TABLET | Freq: Every day | ORAL | 1 refills | Status: DC

## 2020-01-24 ENCOUNTER — Ambulatory Visit: Payer: BLUE CROSS/BLUE SHIELD | Admitting: Family Medicine

## 2020-02-17 IMAGING — CT CT RENAL STONE PROTOCOL
2 of 4 series · 15 of 46 positions shown, 17 images · non-contrast
Comparison: None. Prior ultrasound exams of the pelvis were
pregnancy related from 0003.

CLINICAL DATA: Right flank and lower abdominal pain starting at
[DATE] a.m..

EXAM:
CT ABDOMEN AND PELVIS WITHOUT CONTRAST
TECHNIQUE: Multidetector CT imaging of the abdomen and pelvis was performed
following the standard protocol without IV contrast.

[Series 2: stone full standard · axial · 0.75mm/px · z∈[-470,-50]mm · 12 of 93 slices shown, 14 images]
[im 5/93  soft-tissue]
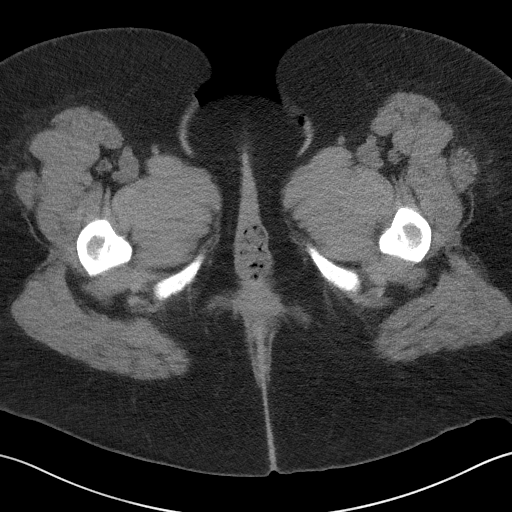
[im 5/93  bone]
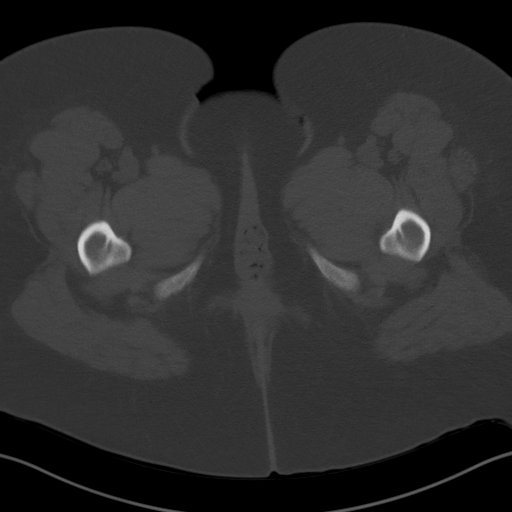
[im 13/93  soft-tissue]
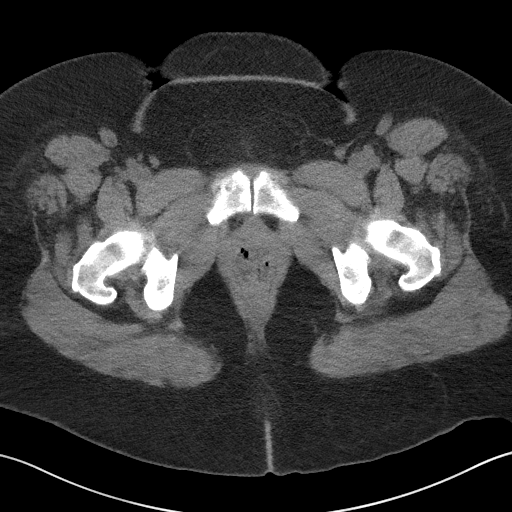
[im 21/93  soft-tissue]
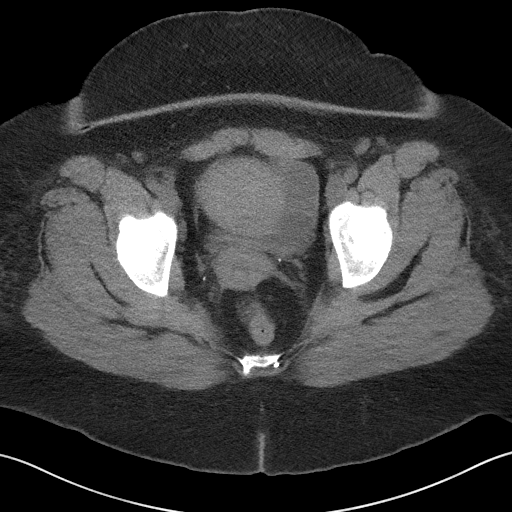
[im 29/93  soft-tissue]
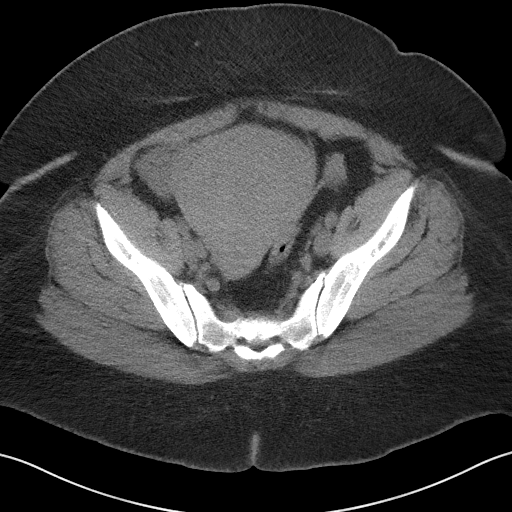
[im 37/93  soft-tissue]
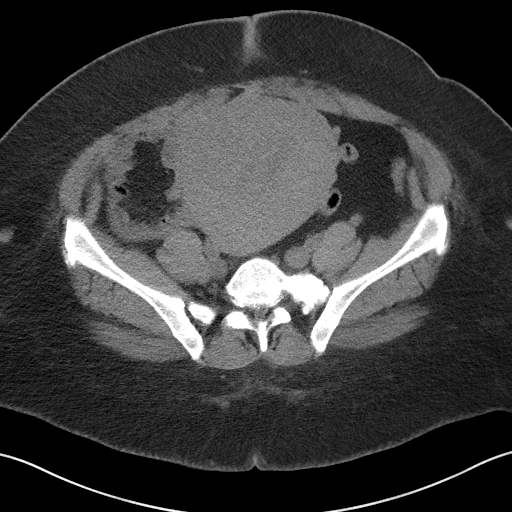
[im 45/93  soft-tissue]
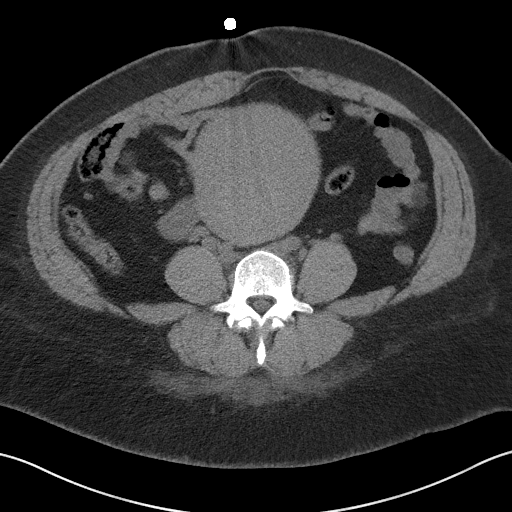
[im 49/93  soft-tissue]
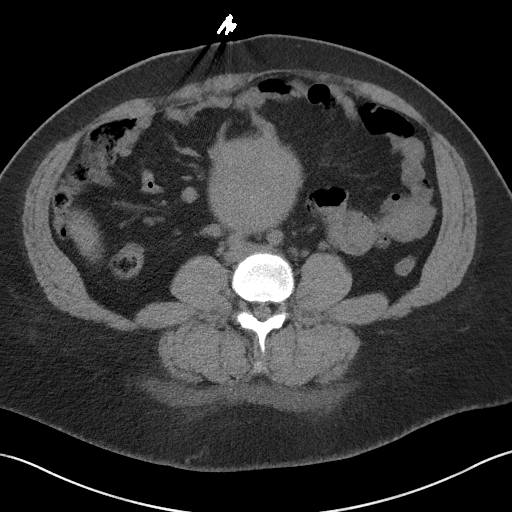
[im 57/93  soft-tissue]
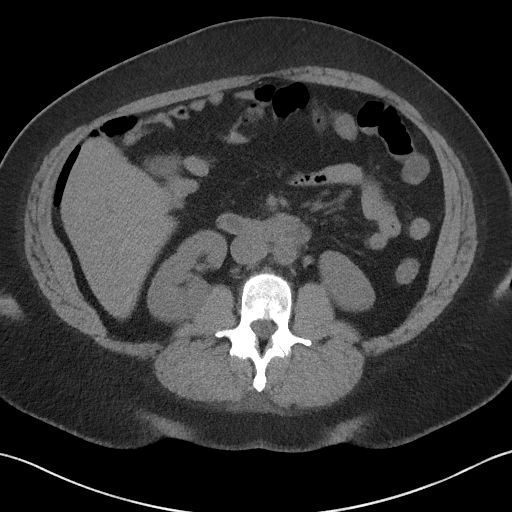
[im 65/93  soft-tissue]
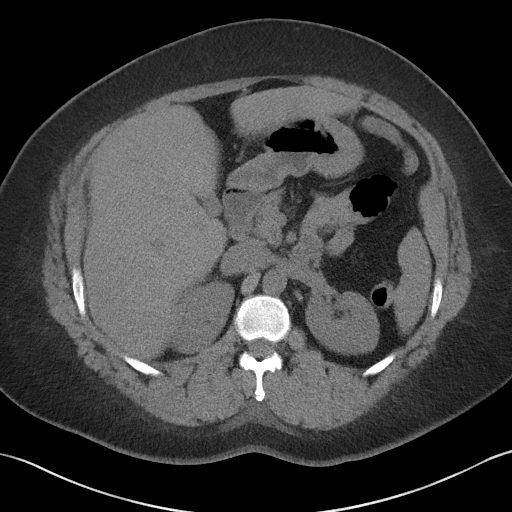
[im 65/93  bone]
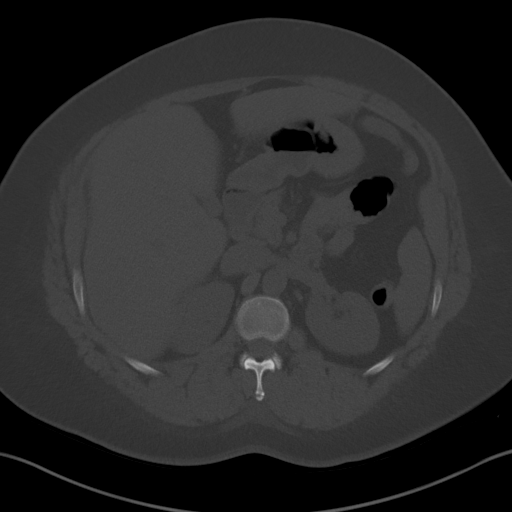
[im 73/93  soft-tissue]
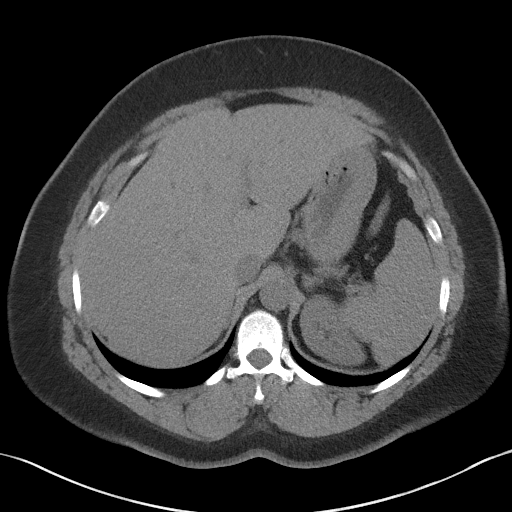
[im 81/93  soft-tissue]
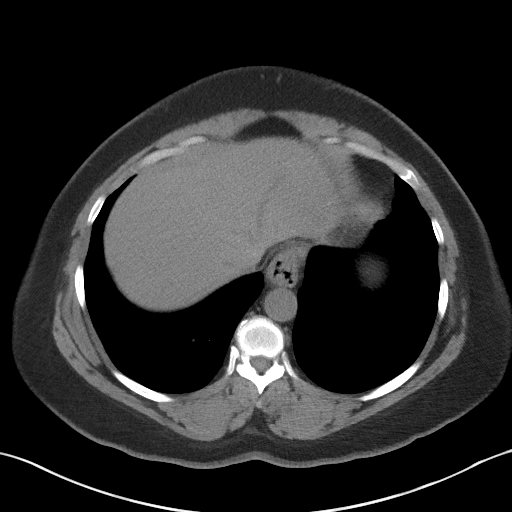
[im 89/93  soft-tissue]
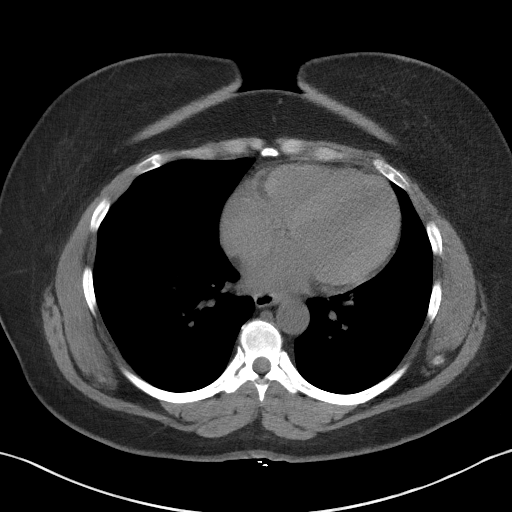

[Series 5: coronal · coronal · 0.78mm/px · 3 of 158 slices shown]
[im 53/158  soft-tissue]
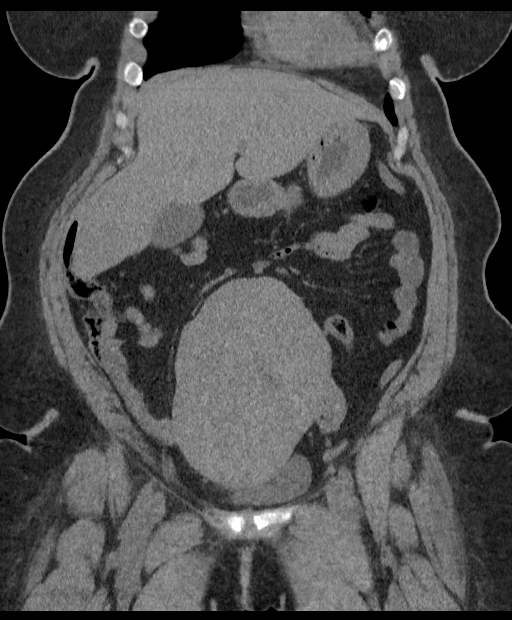
[im 70/158  soft-tissue]
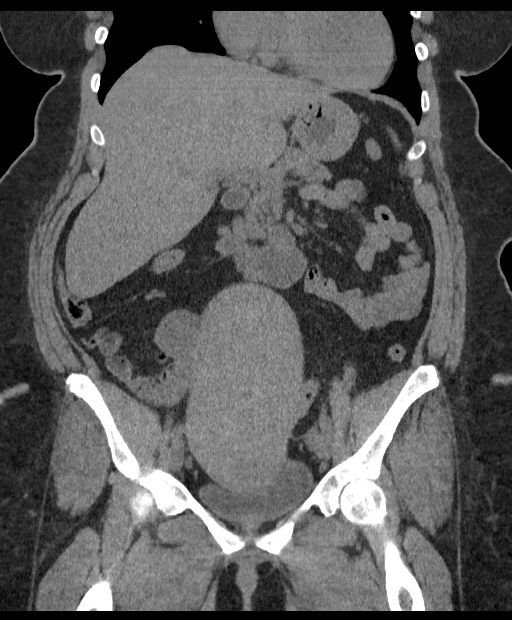
[im 88/158  soft-tissue]
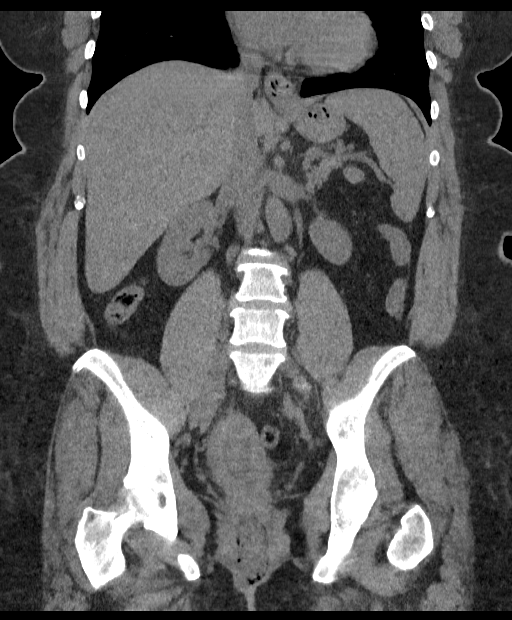

[15 of 46 positions shown; findings below may reference images not displayed]

FINDINGS: Lower chest: Normal heart size without pericardial effusion. Small
hiatal hernia. Clear lung bases.

Hepatobiliary: No focal liver abnormality is seen. No gallstones,
gallbladder wall thickening, or biliary dilatation.

Pancreas: Unremarkable. No pancreatic ductal dilatation or
surrounding inflammatory changes.

Spleen: Normal in size without focal abnormality.

Adrenals/Urinary Tract: Normal bilateral adrenal glands. No
nephrolithiasis nor hydroureteronephrosis. The urinary bladder is
decompressed with extrinsic mass effect from the adjacent uterus.

Stomach/Bowel: Stomach is within normal limits. Appendix appears
normal. No evidence of bowel wall thickening, distention, or
inflammatory changes.

Vascular/Lymphatic: No significant vascular findings are present. No
enlarged abdominal or pelvic lymph nodes.

Reproductive: Bulky appearing uterus measuring at least 17 x 12 x
12.1 cm in craniocaudad by AP by transverse dimension with areas of
hypodensity noted within suspicious for areas of degeneration. This
can be further correlated with MRI. A simple appearing cyst in the
right adnexa measuring 3.6 x 3.7 x 3.4 cm is identified. The left
adnexa is unremarkable. Within the lower uterine segment, there is a
small amount of fluid outlining a 1.5 x 1.4 x 3 cm soft tissue
density possibly a submucosal fibroid, polyp or blood products,
series [DATE].

Other: Slight rectus diastasis. No free air nor free fluid.

Musculoskeletal: No acute or significant osseous findings.
IMPRESSION: 1. Bulky appearing uterus measuring at least 17 x 12 x 12.1 cm in
craniocaudad by AP by transverse dimension with areas of hypodensity
noted within suspicious for areas of degeneration. Findings may
represent stigmata of diffuse uterine fibroid formation. This can be
further correlated with pelvic MRI.
2. Simple appearing right adnexal cyst measuring 3.6 x 3.7 x 3.4 cm.
3. Small amount of fluid outlining a 1.5 x 1.4 x 3 cm soft tissue
density in the lower uterine segment possibly a submucosal fibroid,
polyp or blood products. Direct visual correlation may prove useful
in further assessment.

## 2020-05-08 ENCOUNTER — Other Ambulatory Visit: Payer: Self-pay

## 2020-05-08 DIAGNOSIS — K219 Gastro-esophageal reflux disease without esophagitis: Secondary | ICD-10-CM

## 2020-05-08 MED ORDER — FAMOTIDINE 40 MG PO TABS: 40.0000 mg | ORAL_TABLET | Freq: Every day | ORAL | 1 refills | Status: DC

## 2020-05-16 ENCOUNTER — Other Ambulatory Visit
Admission: RE | Admit: 2020-05-16 | Discharge: 2020-05-16 | Disposition: A | Discharge status: Home / Self Care | Payer: BLUE CROSS/BLUE SHIELD | Source: Ambulatory Visit | Attending: Ophthalmology | Admitting: Ophthalmology

## 2020-05-16 DIAGNOSIS — H2000 Unspecified acute and subacute iridocyclitis: Secondary | ICD-10-CM | POA: Insufficient documentation

## 2020-05-18 LAB — ANGIOTENSIN CONVERTING ENZYME: Angiotensin-Converting Enzyme: 18 U/L (ref 14–82)

## 2020-06-27 ENCOUNTER — Other Ambulatory Visit: Payer: Self-pay

## 2020-06-27 DIAGNOSIS — Z1152 Encounter for screening for COVID-19: Secondary | ICD-10-CM

## 2020-06-27 NOTE — Progress Notes (Signed)
Pt presents today with covid symptoms that started yesterday 06/26/20. CL,RMA

## 2020-06-29 LAB — SARS-COV-2, NAA 2 DAY TAT

## 2020-06-29 LAB — NOVEL CORONAVIRUS, NAA: SARS-CoV-2, NAA: DETECTED — AB

## 2020-06-29 IMAGING — US US PELV - US TRANSVAGINAL
1 series · 13 of 25 positions shown · non-contrast
Comparison: None

CLINICAL DATA: 47 y/o  F; lower abdominal pain.



[Series 1: us pelv - us transvaginal · 0.25mm/px · 13 of 90 slices shown]
[im 1/90]
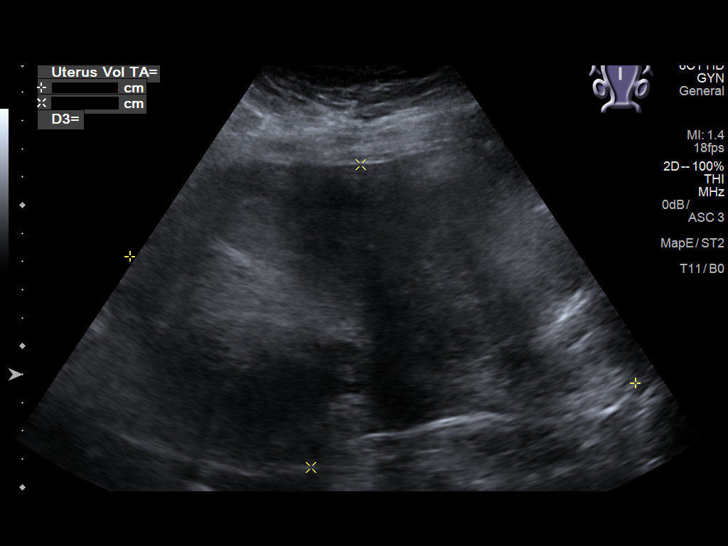
[im 8/90]
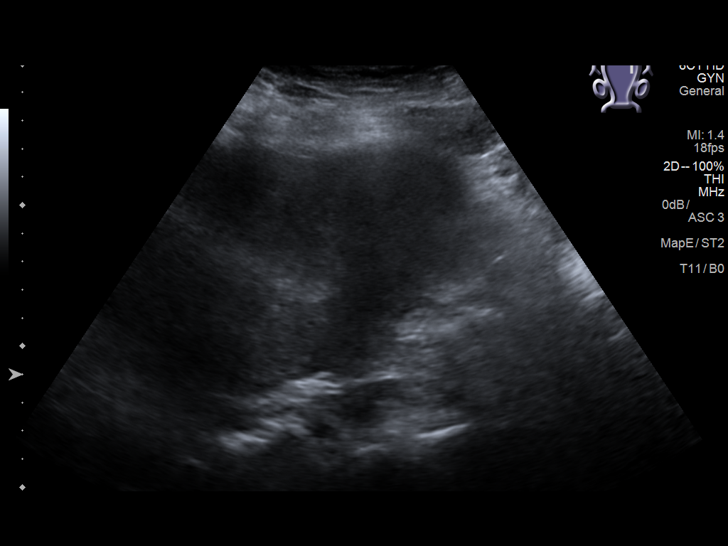
[im 15/90]
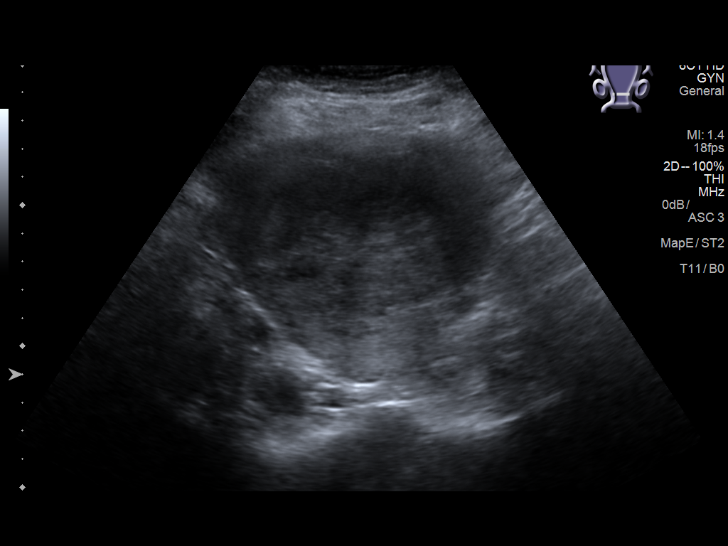
[im 23/90]
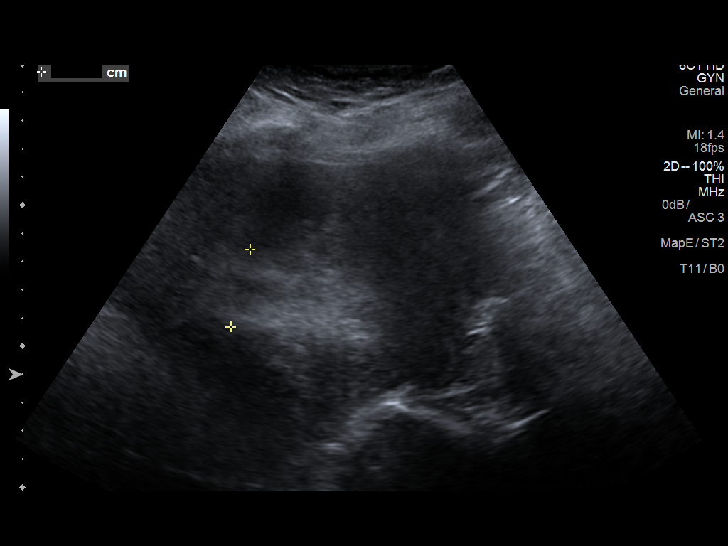
[im 30/90]
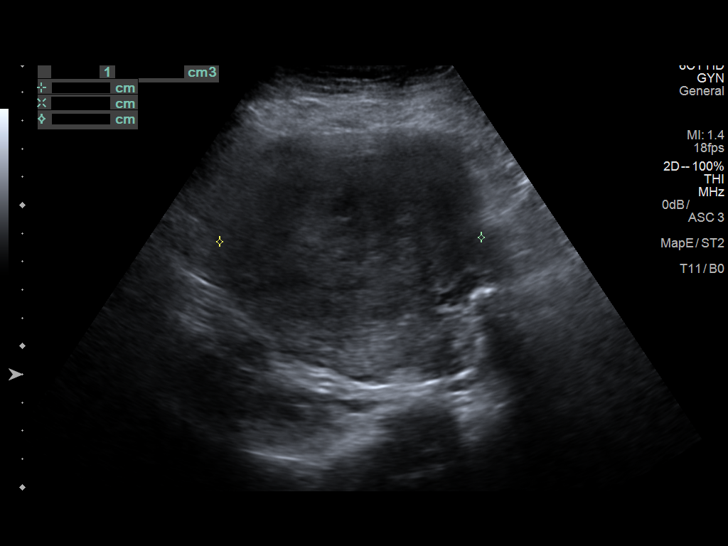
[im 38/90]
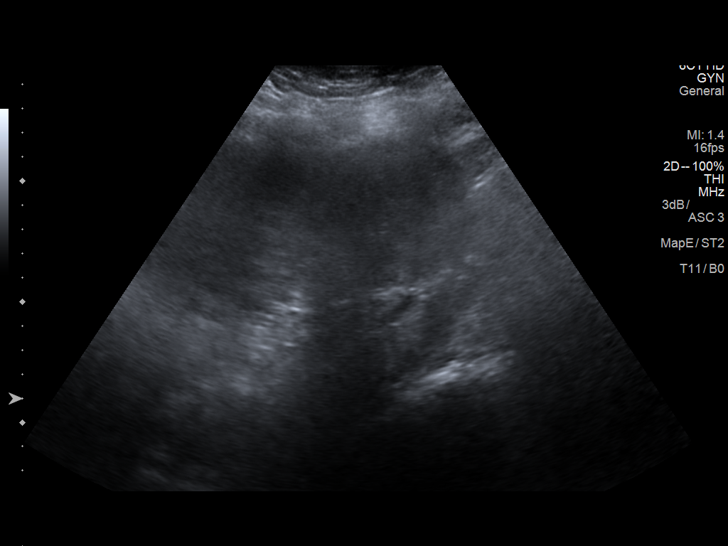
[im 45/90]
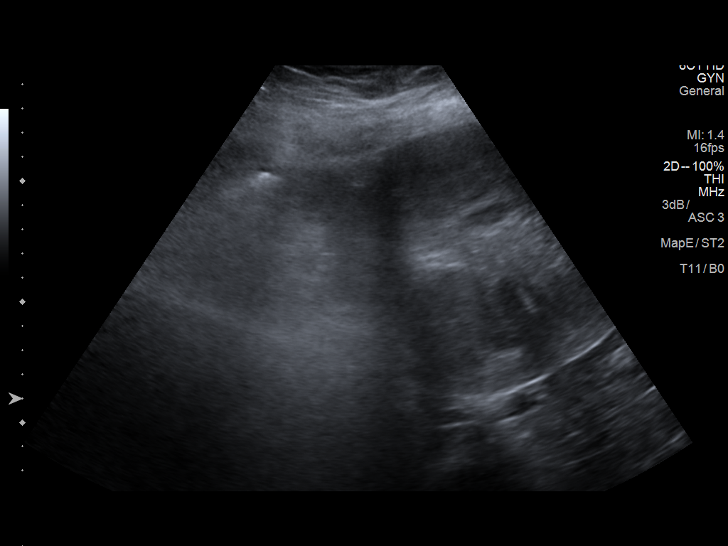
[im 52/90]
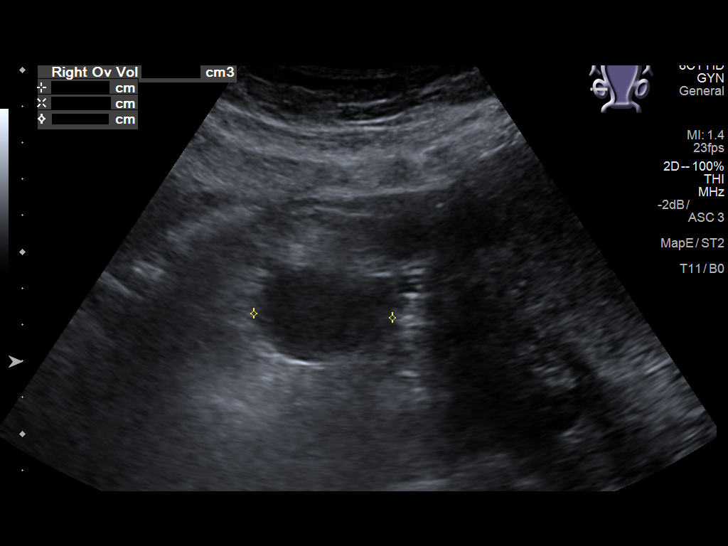
[im 60/90]
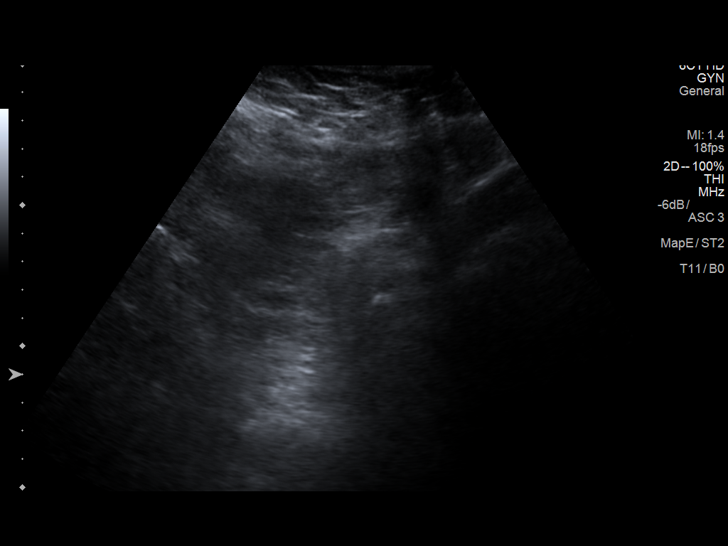
[im 67/90]
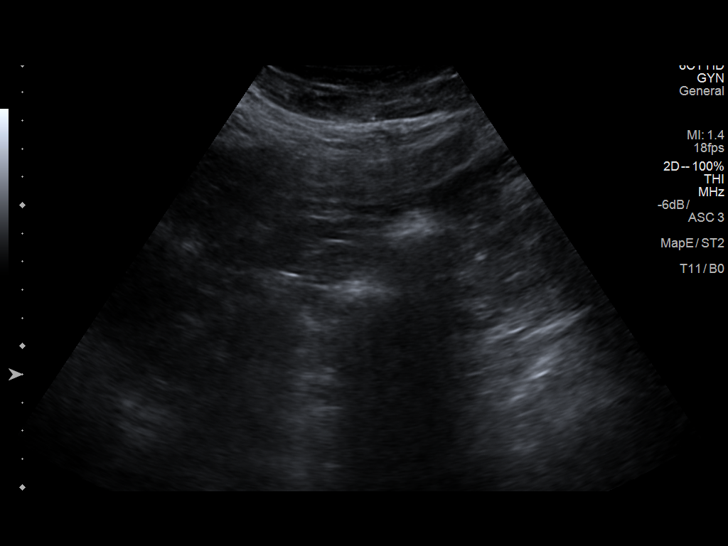
[im 75/90]
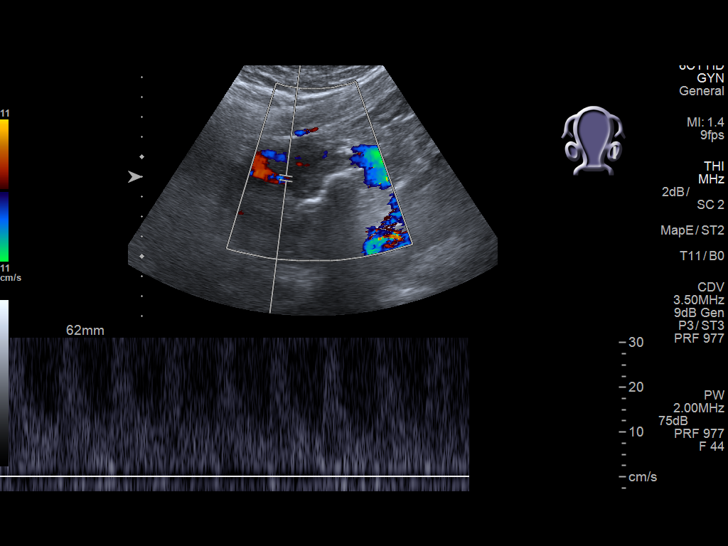
[im 82/90]
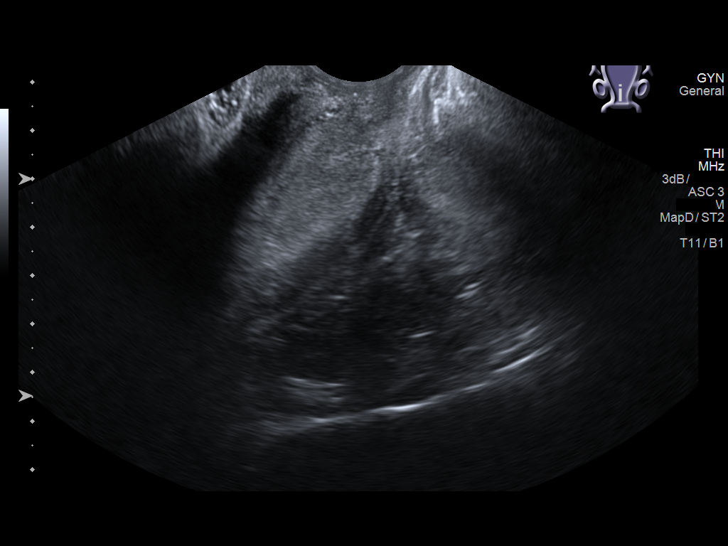
[im 90/90]
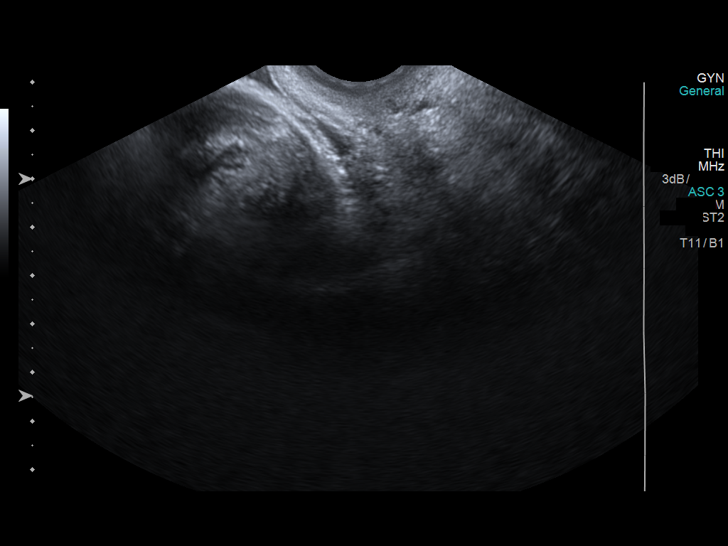

[13 of 25 positions shown; findings below may reference images not displayed]

FINDINGS: Uterus

Measurements: 18.5 x 10.9 x 14.2 cm = volume: 1,499 mL. Anterior mid
myometrial fibroid measuring 7.3 x 8.4 x 9.3 cm. Fundal fibroid
measuring 4.6 x 4.7 x 4.3 cm.

Endometrium

Thickness: 28.3 mm. Incompletely visualized due to large anterior
mid myometrial fibroid.

Right ovary

Measurements: 4.2 x 2.9 x 3.8 cm = volume: 24.1 mL. 3.1 cm simple
cyst.

Left ovary

Measurements: 5.0 x 3.3 x 2.8 cm = volume: 23.9 mL. Normal
appearance/no adnexal mass.

Other findings

No abnormal free fluid.
IMPRESSION: 1. Anterior mid myometrial fibroid measuring up to 9.3 cm. Fundal
fibroid measuring up to 4.7 cm.
2. Endometrium is incompletely visualized due to the large mid
myometrial fibroid. The endometrium measures up to 28 mm at the
fundus. Endometrial thickness is considered abnormal. Consider
follow-up by US in 6-8 weeks, during the week immediately following
menses (exam timing is critical).
3. No acute process identified.

## 2020-09-18 ENCOUNTER — Inpatient Hospital Stay: Payer: BLUE CROSS/BLUE SHIELD | Attending: Oncology

## 2020-09-18 ENCOUNTER — Inpatient Hospital Stay: Payer: BLUE CROSS/BLUE SHIELD

## 2020-09-18 ENCOUNTER — Other Ambulatory Visit: Payer: Self-pay

## 2020-09-18 DIAGNOSIS — Z7984 Long term (current) use of oral hypoglycemic drugs: Secondary | ICD-10-CM | POA: Diagnosis not present

## 2020-09-18 DIAGNOSIS — Z79899 Other long term (current) drug therapy: Secondary | ICD-10-CM | POA: Insufficient documentation

## 2020-09-18 DIAGNOSIS — D509 Iron deficiency anemia, unspecified: Secondary | ICD-10-CM | POA: Diagnosis not present

## 2020-09-18 DIAGNOSIS — D472 Monoclonal gammopathy: Secondary | ICD-10-CM | POA: Insufficient documentation

## 2020-09-18 DIAGNOSIS — I10 Essential (primary) hypertension: Secondary | ICD-10-CM | POA: Insufficient documentation

## 2020-09-18 DIAGNOSIS — D500 Iron deficiency anemia secondary to blood loss (chronic): Secondary | ICD-10-CM

## 2020-09-18 LAB — CBC WITH DIFFERENTIAL/PLATELET
Abs Immature Granulocytes: 0.02 10*3/uL (ref 0.00–0.07)
Basophils Absolute: 0.1 10*3/uL (ref 0.0–0.1)
Basophils Relative: 1 %
Eosinophils Absolute: 0.6 10*3/uL — ABNORMAL HIGH (ref 0.0–0.5)
Eosinophils Relative: 6 %
HCT: 38.8 % (ref 36.0–46.0)
Hemoglobin: 12.7 g/dL (ref 12.0–15.0)
Immature Granulocytes: 0 %
Lymphocytes Relative: 30 %
Lymphs Abs: 2.8 10*3/uL (ref 0.7–4.0)
MCH: 28.6 pg (ref 26.0–34.0)
MCHC: 32.7 g/dL (ref 30.0–36.0)
MCV: 87.4 fL (ref 80.0–100.0)
Monocytes Absolute: 0.5 10*3/uL (ref 0.1–1.0)
Monocytes Relative: 5 %
Neutro Abs: 5.4 10*3/uL (ref 1.7–7.7)
Neutrophils Relative %: 58 %
Platelets: 446 10*3/uL — ABNORMAL HIGH (ref 150–400)
RBC: 4.44 MIL/uL (ref 3.87–5.11)
RDW: 15.4 % (ref 11.5–15.5)
WBC: 9.3 10*3/uL (ref 4.0–10.5)
nRBC: 0 % (ref 0.0–0.2)

## 2020-09-18 LAB — IRON AND TIBC
Iron: 49 ug/dL (ref 28–170)
Saturation Ratios: 18 % (ref 10.4–31.8)
TIBC: 270 ug/dL (ref 250–450)
UIBC: 221 ug/dL

## 2020-09-18 LAB — FERRITIN: Ferritin: 193 ng/mL (ref 11–307)

## 2020-09-19 LAB — IGG, IGA, IGM
IgA: 425 mg/dL — ABNORMAL HIGH (ref 87–352)
IgG (Immunoglobin G), Serum: 1716 mg/dL — ABNORMAL HIGH (ref 586–1602)
IgM (Immunoglobulin M), Srm: 83 mg/dL (ref 26–217)

## 2020-09-19 LAB — KAPPA/LAMBDA LIGHT CHAINS
Kappa free light chain: 37.1 mg/L — ABNORMAL HIGH (ref 3.3–19.4)
Kappa, lambda light chain ratio: 1.82 — ABNORMAL HIGH (ref 0.26–1.65)
Lambda free light chains: 20.4 mg/L (ref 5.7–26.3)

## 2020-09-20 LAB — PROTEIN ELECTROPHORESIS, SERUM
A/G Ratio: 1 (ref 0.7–1.7)
Albumin ELP: 3.8 g/dL (ref 2.9–4.4)
Alpha-1-Globulin: 0.2 g/dL (ref 0.0–0.4)
Alpha-2-Globulin: 0.8 g/dL (ref 0.4–1.0)
Beta Globulin: 1.1 g/dL (ref 0.7–1.3)
Gamma Globulin: 1.6 g/dL (ref 0.4–1.8)
Globulin, Total: 3.7 g/dL (ref 2.2–3.9)
M-Spike, %: 0.6 g/dL — ABNORMAL HIGH
Total Protein ELP: 7.5 g/dL (ref 6.0–8.5)

## 2020-09-25 ENCOUNTER — Inpatient Hospital Stay (HOSPITAL_BASED_OUTPATIENT_CLINIC_OR_DEPARTMENT_OTHER): Payer: BLUE CROSS/BLUE SHIELD | Admitting: Oncology

## 2020-09-25 ENCOUNTER — Encounter: Payer: Self-pay | Admitting: Oncology

## 2020-09-25 VITALS — BP 136/82 | HR 87 | Temp 97.8°F

## 2020-09-25 DIAGNOSIS — D500 Iron deficiency anemia secondary to blood loss (chronic): Secondary | ICD-10-CM | POA: Diagnosis not present

## 2020-09-25 DIAGNOSIS — D472 Monoclonal gammopathy: Secondary | ICD-10-CM

## 2020-09-25 NOTE — Progress Notes (Signed)
[place]  Telephone:(336) [phone]  Fax:([phone]) [place] DOB: 10/04/1970  MR#: [id number]  GYJ#:[id number]  Date of service 02/22/2015.  Patient Care Team: Hubbard Hartshorn, FNP as PCP - General (Family Medicine)  CHIEF COMPLAINT:  Iron deficiency anemia, MGUS.  INTERVAL HISTORY: Patient returns to clinic today for repeat laboratory work and further evaluation.  She was last seen in clinic on 09/17/2019.  She last received IV Feraheme on 08/04/2018 and 08/12/2018.  In the interim, she has done well.  Denies any recent hospitalizations.  She has not been taking her oral iron supplements.  She denies any GI bleeding or further menstrual bleeding since her hysterectomy.  She is feeling well.  She does not complain of any weakness or fatigue.  She has no neurologic complaints. She denies any recent fevers or illnesses.  She denies any chest pain, shortness of breath, cough, or hemoptysis.  She denies any nausea, vomiting, constipation, or diarrhea. She has no melena or hematochezia.  She has no urinary complaints.  Patient feels at her baseline offers no specific complaints today.  REVIEW OF SYSTEMS:   Review of Systems  Constitutional: Negative.  Negative for fever, malaise/fatigue and weight loss.  Respiratory: Negative.  Negative for cough and shortness of breath.   Cardiovascular: Negative.  Negative for chest pain and leg swelling.  Gastrointestinal: Negative.  Negative for abdominal pain, blood in stool and melena.  Genitourinary: Negative.  Negative for hematuria.  Musculoskeletal: Negative.  Negative for back pain.  Skin: Negative.  Negative for rash.  Neurological: Negative.  Negative for sensory change, focal weakness, weakness and headaches.  Psychiatric/Behavioral: Negative.  The patient is not nervous/anxious.     As per HPI. Otherwise, a complete review of systems is negative.  PAST MEDICAL HISTORY: Past Medical History:  Diagnosis Date  .  Anemia   . Anemia associated with acute blood loss 05/14/2018  . Fibroid uterus 05/20/2018  . Fibroids   . History of blood transfusion   . Hypertension   . Menorrhagia with irregular cycle 05/14/2018    PAST SURGICAL HISTORY: Past Surgical History:  Procedure Laterality Date  . ABDOMINAL HYSTERECTOMY  09/11/2018  . CESAREAN SECTION  1999  . CESAREAN SECTION  07/1991  . DILATATION & CURETTAGE/HYSTEROSCOPY WITH MYOSURE N/A 05/21/2018   Procedure: DILATATION & CURETTAGE/HYSTEROSCOPY WITH MYOSURE ENDO CERVICAL POLYPECTOMY;  Surgeon: Will Bonnet, MD;  Location: [facility] ORS;  Service: Gynecology;  Laterality: N/A;    FAMILY HISTORY Family History  Problem Relation Age of Onset  . Diabetes Mother   . Breast cancer Neg Hx     GYNECOLOGIC HISTORY:  09/07/2015    HEALTH MAINTENANCE: Social History   Tobacco Use  . Smoking status: Never Smoker  . Smokeless tobacco: Never Used  Vaping Use  . Vaping Use: Never used  Substance Use Topics  . Alcohol use: Yes    Alcohol/week: 1.0 standard drink    Types: 1 Glasses of wine per week    Comment: once a month  . Drug use: No    No Known Allergies  Current Outpatient Medications  Medication Sig Dispense Refill  . atorvastatin (LIPITOR) 20 MG tablet Take 1 tablet (20 mg total) by mouth daily. 90 tablet 1  . blood glucose meter kit and supplies KIT Dispense based on patient and insurance preference. Check fasting blood sugar once daily as directed. 1 each 0  . famotidine (PEPCID) 40 MG tablet Take 1 tablet (40  mg total) by mouth daily. 90 tablet 1  . ferrous sulfate 325 (65 FE) MG EC tablet Take 325 mg by mouth 3 (three) times daily with meals.    Marland Kitchen glucose blood test strip Use as instructed 100 each 12  . hydrochlorothiazide (HYDRODIURIL) 25 MG tablet Take 1 tablet (25 mg total) by mouth daily. 90 tablet 1  . Lancets (ACCU-CHEK MULTICLIX) lancets Use as instructed 100 each 12  . metFORMIN (GLUCOPHAGE) 500 MG tablet Take 1 tablet  (500 mg total) by mouth daily with breakfast for 7 days, THEN 1 tablet (500 mg total) 2 (two) times daily with a meal. 180 tablet 1   No current facility-administered medications for this visit.    OBJECTIVE: LMP 05/06/2018 (Approximate) Comment: neg hcg   There is no height or weight on file to calculate BMI.    ECOG FS:0 - Asymptomatic  Physical Exam Constitutional:      Appearance: Normal appearance.  HENT:     Head: Normocephalic and atraumatic.  Eyes:     Pupils: Pupils are equal, round, and reactive to light.  Cardiovascular:     Rate and Rhythm: Normal rate and regular rhythm.     Heart sounds: Normal heart sounds. No murmur heard.   Pulmonary:     Effort: Pulmonary effort is normal.     Breath sounds: Normal breath sounds. No wheezing.  Abdominal:     General: Bowel sounds are normal. There is no distension.     Palpations: Abdomen is soft.     Tenderness: There is no abdominal tenderness.  Musculoskeletal:        General: Normal range of motion.     Cervical back: Normal range of motion.  Skin:    General: Skin is warm and dry.     Findings: No rash.  Neurological:     Mental Status: She is alert and oriented to person, place, and time.  Psychiatric:        Judgment: Judgment normal.     LAB RESULTS:  CBC    Component Value Date/Time   WBC 9.3 09/18/2020 1434   RBC 4.44 09/18/2020 1434   HGB 12.7 09/18/2020 1434   HGB 10.6 (L) 09/23/2014 0820   HCT 38.8 09/18/2020 1434   HCT 33.2 (L) 09/23/2014 0820   PLT 446 (H) 09/18/2020 1434   PLT 420 09/23/2014 0820   MCV 87.4 09/18/2020 1434   MCV 87 09/23/2014 0820   MCH 28.6 09/18/2020 1434   MCHC 32.7 09/18/2020 1434   RDW 15.4 09/18/2020 1434   RDW 16.4 (H) 09/23/2014 0820   LYMPHSABS 2.8 09/18/2020 1434   LYMPHSABS 2.2 09/23/2014 0820   MONOABS 0.5 09/18/2020 1434   MONOABS 0.5 09/23/2014 0820   EOSABS 0.6 (H) 09/18/2020 1434   EOSABS 0.4 09/23/2014 0820   BASOSABS 0.1 09/18/2020 1434   BASOSABS  0.1 09/23/2014 0820     Lab Results  Component Value Date   TOTALPROTELP 7.5 09/18/2020   ALBUMINELP 3.8 09/18/2020   A1GS 0.2 09/18/2020   A2GS 0.8 09/18/2020   BETS 1.1 09/18/2020   GAMS 1.6 09/18/2020   MSPIKE 0.6 (H) 09/18/2020   SPEI Comment 09/18/2020    Lab Results  Component Value Date   IRON 49 09/18/2020   TIBC 270 09/18/2020   IRONPCTSAT 18 09/18/2020   Lab Results  Component Value Date   FERRITIN 193 09/18/2020      STUDIES: No results found.  ASSESSMENT:  Iron deficiency anemia, MGUS.  PLAN:  1. Iron deficiency anemia:  -Secondary to heavy menstrual cycles. -She is status post hysterectomy with improvement. -Last received IV iron on 08/04/2018 and 08/12/2018. -Labs from 09/18/2020 show a ferritin of 193, iron saturation 18% and a hemoglobin of 12.7. -Restart iron supplements 1/day. -No additional IV iron is needed at this time. -Repeat labs in 1 year.  2. MGUS:  -Currently on observation. -Labs from 09/18/2020 show an M spike of 0.6 (0.8) which is unchanged from August 2016. -Lab work shows an IgG prominence which is less likely to progress to overt multiple myeloma. -Kappa lambda light chain ratio is slightly elevated at 1.82. -No evidence of endorgan damage. -Continue to monitor and repeat in 1 year.  Disposition: -RTC in 1 year for repeat labs and MD assessment.  Greater than 50% was spent in counseling and coordination of care with this patient including but not limited to discussion of the relevant topics above (See A&P) including, but not limited to diagnosis and management of acute and chronic medical conditions.   Patient expressed understanding and was in agreement with this plan. She also understands that She can call clinic at any time with any questions, concerns, or complaints.    Jacquelin Hawking, NP   09/25/2020 8:18 AM

## 2020-09-25 NOTE — Progress Notes (Signed)
Patient denies any concerns today.  

## 2021-09-12 ENCOUNTER — Other Ambulatory Visit: Payer: Self-pay | Admitting: *Deleted

## 2021-09-12 DIAGNOSIS — D500 Iron deficiency anemia secondary to blood loss (chronic): Secondary | ICD-10-CM

## 2021-09-18 ENCOUNTER — Other Ambulatory Visit: Payer: Self-pay

## 2021-09-18 ENCOUNTER — Inpatient Hospital Stay: Payer: BLUE CROSS/BLUE SHIELD | Attending: Oncology

## 2021-09-18 DIAGNOSIS — D509 Iron deficiency anemia, unspecified: Secondary | ICD-10-CM | POA: Diagnosis not present

## 2021-09-18 DIAGNOSIS — D472 Monoclonal gammopathy: Secondary | ICD-10-CM | POA: Insufficient documentation

## 2021-09-18 DIAGNOSIS — D500 Iron deficiency anemia secondary to blood loss (chronic): Secondary | ICD-10-CM

## 2021-09-18 LAB — CBC WITH DIFFERENTIAL/PLATELET
Abs Immature Granulocytes: 0.02 10*3/uL (ref 0.00–0.07)
Basophils Absolute: 0.1 10*3/uL (ref 0.0–0.1)
Basophils Relative: 1 %
Eosinophils Absolute: 0.5 10*3/uL (ref 0.0–0.5)
Eosinophils Relative: 6 %
HCT: 38.2 % (ref 36.0–46.0)
Hemoglobin: 12.7 g/dL (ref 12.0–15.0)
Immature Granulocytes: 0 %
Lymphocytes Relative: 35 %
Lymphs Abs: 3.1 10*3/uL (ref 0.7–4.0)
MCH: 29.8 pg (ref 26.0–34.0)
MCHC: 33.2 g/dL (ref 30.0–36.0)
MCV: 89.7 fL (ref 80.0–100.0)
Monocytes Absolute: 0.6 10*3/uL (ref 0.1–1.0)
Monocytes Relative: 6 %
Neutro Abs: 4.6 10*3/uL (ref 1.7–7.7)
Neutrophils Relative %: 52 %
Platelets: 380 10*3/uL (ref 150–400)
RBC: 4.26 MIL/uL (ref 3.87–5.11)
RDW: 14 % (ref 11.5–15.5)
WBC: 8.9 10*3/uL (ref 4.0–10.5)
nRBC: 0 % (ref 0.0–0.2)

## 2021-09-18 LAB — IRON AND TIBC
Iron: 57 ug/dL (ref 28–170)
Saturation Ratios: 19 % (ref 10.4–31.8)
TIBC: 305 ug/dL (ref 250–450)
UIBC: 248 ug/dL

## 2021-09-18 LAB — FERRITIN: Ferritin: 121 ng/mL (ref 11–307)

## 2021-09-19 LAB — KAPPA/LAMBDA LIGHT CHAINS
Kappa free light chain: 33.3 mg/L — ABNORMAL HIGH (ref 3.3–19.4)
Kappa, lambda light chain ratio: 1.87 — ABNORMAL HIGH (ref 0.26–1.65)
Lambda free light chains: 17.8 mg/L (ref 5.7–26.3)

## 2021-09-19 LAB — IGG, IGA, IGM
IgA: 385 mg/dL — ABNORMAL HIGH (ref 87–352)
IgG (Immunoglobin G), Serum: 1726 mg/dL — ABNORMAL HIGH (ref 586–1602)
IgM (Immunoglobulin M), Srm: 79 mg/dL (ref 26–217)

## 2021-09-20 LAB — PROTEIN ELECTROPHORESIS, SERUM
A/G Ratio: 1 (ref 0.7–1.7)
Albumin ELP: 3.6 g/dL (ref 2.9–4.4)
Alpha-1-Globulin: 0.2 g/dL (ref 0.0–0.4)
Alpha-2-Globulin: 0.7 g/dL (ref 0.4–1.0)
Beta Globulin: 1.1 g/dL (ref 0.7–1.3)
Gamma Globulin: 1.6 g/dL (ref 0.4–1.8)
Globulin, Total: 3.5 g/dL (ref 2.2–3.9)
M-Spike, %: 0.7 g/dL — ABNORMAL HIGH
Total Protein ELP: 7.1 g/dL (ref 6.0–8.5)

## 2021-09-25 ENCOUNTER — Inpatient Hospital Stay (HOSPITAL_BASED_OUTPATIENT_CLINIC_OR_DEPARTMENT_OTHER): Payer: BLUE CROSS/BLUE SHIELD | Admitting: Oncology

## 2021-09-25 DIAGNOSIS — D500 Iron deficiency anemia secondary to blood loss (chronic): Secondary | ICD-10-CM

## 2021-09-25 DIAGNOSIS — D472 Monoclonal gammopathy: Secondary | ICD-10-CM | POA: Diagnosis not present

## 2021-09-25 NOTE — Progress Notes (Signed)
?[facility]  ?[phone]  [phone]  ? ? ? ?[name] DOB: 1970/11/01  MR#: [id number]  SKA#:[id number] ? ?Date of service 02/22/2015. ? ?Patient Care Team: ?Hubbard Hartshorn, FNP as PCP - General (Family Medicine) ? ?I connected with Con Memos on 09/27/21 at  3:30 PM EDT by video enabled telemedicine visit and verified that I am speaking with the correct person using two identifiers.  ? ?I discussed the limitations, risks, security and privacy concerns of performing an evaluation and management service by telemedicine and the availability of in-person appointments. I also discussed with the patient that there may be a patient responsible charge related to this service. The patient expressed understanding and agreed to proceed.  ? ?Other persons participating in the visit and their role in the encounter: Patient, MD. ? ?Patient?s location: Home. ?Provider?s location: Clinic. ? ?CHIEF COMPLAINT:  Iron deficiency anemia, MGUS. ? ?INTERVAL HISTORY: Patient agreed to video assisted telemedicine visit for yearly evaluation and discussion of her laboratory results.  She continues to feel well and remains asymptomatic.  She has no neurologic complaints. She denies any recent fevers or illnesses.  She denies any chest pain, shortness of breath, cough, or hemoptysis.  She denies any nausea, vomiting, constipation, or diarrhea. She has no melena or hematochezia.  She has no urinary complaints.  Patient offers no specific complaints today. ? ?REVIEW OF SYSTEMS:   ?Review of Systems  ?Constitutional: Negative.  Negative for fever, malaise/fatigue and weight loss.  ?Respiratory: Negative.  Negative for cough and shortness of breath.   ?Cardiovascular: Negative.  Negative for chest pain and leg swelling.  ?Gastrointestinal: Negative.  Negative for abdominal pain, blood in stool and melena.  ?Genitourinary: Negative.  Negative for hematuria.  ?Musculoskeletal: Negative.  Negative  for back pain.  ?Skin: Negative.  Negative for rash.  ?Neurological: Negative.  Negative for sensory change, focal weakness, weakness and headaches.  ?Psychiatric/Behavioral: Negative.  The patient is not nervous/anxious.   ? ?As per HPI. Otherwise, a complete review of systems is negative. ? ?PAST MEDICAL HISTORY: ?Past Medical History:  ?Diagnosis Date  ? Anemia   ? Anemia associated with acute blood loss 05/14/2018  ? Fibroid uterus 05/20/2018  ? Fibroids   ? History of blood transfusion   ? Hypertension   ? Menorrhagia with irregular cycle 05/14/2018  ? ? ?PAST SURGICAL HISTORY: ?Past Surgical History:  ?Procedure Laterality Date  ? ABDOMINAL HYSTERECTOMY  09/11/2018  ? [place]  ? CESAREAN SECTION  07/1991  ? DILATATION & CURETTAGE/HYSTEROSCOPY WITH MYOSURE N/A 05/21/2018  ? Procedure: DILATATION & CURETTAGE/HYSTEROSCOPY WITH MYOSURE ENDO CERVICAL POLYPECTOMY;  Surgeon: Will Bonnet, MD;  Location: [facility] ORS;  Service: Gynecology;  Laterality: N/A;  ? ? ?FAMILY HISTORY ?Family History  ?Problem Relation Age of Onset  ? Diabetes Mother   ? Breast cancer Neg Hx   ? ? ?GYNECOLOGIC HISTORY:  09/07/2015  ? ? ?HEALTH MAINTENANCE: ?Social History  ? ?Tobacco Use  ? Smoking status: Never  ? Smokeless tobacco: Never  ?Vaping Use  ? Vaping Use: Never used  ?Substance Use Topics  ? Alcohol use: Yes  ?  Alcohol/week: 1.0 standard drink  ?  Types: 1 Glasses of wine per week  ?  Comment: once a month  ? Drug use: No  ? ? ?No Known Allergies ? ?Current Outpatient Medications  ?Medication Sig Dispense Refill  ? atorvastatin (LIPITOR) 20 MG tablet Take 1 tablet (20 mg total) by  mouth daily. 90 tablet 1  ? blood glucose meter kit and supplies KIT Dispense based on patient and insurance preference. Check fasting blood sugar once daily as directed. 1 each 0  ? famotidine (PEPCID) 40 MG tablet Take 1 tablet (40 mg total) by mouth daily. 90 tablet 1  ? ferrous sulfate 325 (65 FE) MG EC tablet Take 325 mg by mouth 3  (three) times daily with meals.    ? glucose blood test strip Use as instructed 100 each 12  ? Lancets (ACCU-CHEK MULTICLIX) lancets Use as instructed 100 each 12  ? metFORMIN (GLUCOPHAGE) 500 MG tablet Take 1 tablet (500 mg total) by mouth daily with breakfast for 7 days, THEN 1 tablet (500 mg total) 2 (two) times daily with a meal. 180 tablet 1  ? ?No current facility-administered medications for this visit.  ? ? ?OBJECTIVE: ?LMP 05/06/2018 (Approximate) Comment: neg hcg   There is no height or weight on file to calculate BMI.    ECOG FS:0 - Asymptomatic ? ?General: Well-developed, well-nourished, no acute distress. ?HEENT: Normocephalic. ?Neuro: Alert, answering all questions appropriately. Cranial nerves grossly intact. ?Psych: Normal affect. ? ?LAB RESULTS: ? ?CBC ?   ?Component Value Date/Time  ? WBC 8.9 09/18/2021 1538  ? RBC 4.26 09/18/2021 1538  ? HGB 12.7 09/18/2021 1538  ? HGB 10.6 (L) 09/23/2014 0820  ? HCT 38.2 09/18/2021 1538  ? HCT 33.2 (L) 09/23/2014 0820  ? PLT 380 09/18/2021 1538  ? PLT 420 09/23/2014 0820  ? MCV 89.7 09/18/2021 1538  ? MCV 87 09/23/2014 0820  ? MCH 29.8 09/18/2021 1538  ? MCHC 33.2 09/18/2021 1538  ? RDW 14.0 09/18/2021 1538  ? RDW 16.4 (H) 09/23/2014 0820  ? LYMPHSABS 3.1 09/18/2021 1538  ? LYMPHSABS 2.2 09/23/2014 0820  ? MONOABS 0.6 09/18/2021 1538  ? MONOABS 0.5 09/23/2014 0820  ? EOSABS 0.5 09/18/2021 1538  ? EOSABS 0.4 09/23/2014 0820  ? BASOSABS 0.1 09/18/2021 1538  ? BASOSABS 0.1 09/23/2014 0820  ? ? ? ?Lab Results  ?Component Value Date  ? TOTALPROTELP 7.1 09/18/2021  ? ALBUMINELP 3.6 09/18/2021  ? A1GS 0.2 09/18/2021  ? A2GS 0.7 09/18/2021  ? BETS 1.1 09/18/2021  ? GAMS 1.6 09/18/2021  ? MSPIKE 0.7 (H) 09/18/2021  ? SPEI Comment 09/18/2021  ? ? ?Lab Results  ?Component Value Date  ? IRON 57 09/18/2021  ? TIBC 305 09/18/2021  ? IRONPCTSAT 19 09/18/2021  ? ?Lab Results  ?Component Value Date  ? FERRITIN 121 09/18/2021  ? ? ? ? ?STUDIES: ?No results found. ? ?ASSESSMENT:   Iron deficiency anemia, MGUS. ? ?PLAN:  ? ?1. Iron deficiency anemia: Resolved with hysterectomy.  Patient's hemoglobin and iron stores continue to be within normal limits.  No intervention is needed at this time.  Return to clinic in 1 year with repeat laboratory work and routine evaluation.   ?2. MGUS: Chronic and unchanged.  Patient's M spike has ranged from 0.6-1.0 since August 2016.  Her most recent result is 0.7.  She has an elevated IgG level of 1726 which is unchanged.  Kappa chains are also unchanged at 33.3.  Patients with an IgG prominence are less likely to progress to overt multiple myeloma.  She has no evidence of endorgan damage.  She does not require metastatic bone survey or bone marrow biopsy at this time.  If patient's laboratory work remains stable for a total of 10 years in August 2026, can likely stop monitoring and patient can be  discharged from clinic.  No intervention is needed at this time.  Return to clinic in 1 year with repeat laboratory work and video assisted telemedicine.   ? ?I provided 20 minutes of face-to-face video visit time during this encounter which included chart review, counseling, and coordination of care as documented above. ? ? ?Patient expressed understanding and was in agreement with this plan. She also understands that She can call clinic at any time with any questions, concerns, or complaints.  ? ? ?Lloyd Huger, MD   09/27/2021 8:40 AM ? ?

## 2021-09-27 ENCOUNTER — Encounter: Payer: Self-pay | Admitting: Oncology

## 2021-11-19 ENCOUNTER — Other Ambulatory Visit: Payer: Self-pay | Admitting: Ophthalmology

## 2021-11-19 ENCOUNTER — Other Ambulatory Visit
Admission: RE | Admit: 2021-11-19 | Discharge: 2021-11-19 | Disposition: A | Discharge status: Home / Self Care | Payer: BLUE CROSS/BLUE SHIELD | Source: Ambulatory Visit | Attending: Internal Medicine | Admitting: Internal Medicine

## 2021-11-19 ENCOUNTER — Ambulatory Visit
Admission: RE | Admit: 2021-11-19 | Discharge: 2021-11-19 | Disposition: A | Discharge status: Home / Self Care | Payer: BLUE CROSS/BLUE SHIELD | Source: Ambulatory Visit | Attending: Ophthalmology | Admitting: Ophthalmology

## 2021-11-19 ENCOUNTER — Ambulatory Visit
Admission: RE | Admit: 2021-11-19 | Discharge: 2021-11-19 | Disposition: A | Discharge status: Home / Self Care | Payer: BLUE CROSS/BLUE SHIELD | Source: Ambulatory Visit | Attending: Internal Medicine | Admitting: Internal Medicine

## 2021-11-19 DIAGNOSIS — D869 Sarcoidosis, unspecified: Secondary | ICD-10-CM

## 2021-11-19 DIAGNOSIS — H209 Unspecified iridocyclitis: Secondary | ICD-10-CM | POA: Diagnosis present

## 2021-11-20 LAB — ANGIOTENSIN CONVERTING ENZYME: Angiotensin-Converting Enzyme: 7 U/L — ABNORMAL LOW (ref 14–82)

## 2021-11-21 LAB — RPR: RPR Ser Ql: NONREACTIVE

## 2021-11-22 LAB — QUANTIFERON-TB GOLD PLUS (RQFGPL)
QuantiFERON Mitogen Value: >10 IU/mL
QuantiFERON Nil Value: 0 IU/mL
QuantiFERON TB1 Ag Value: 0.1 IU/mL
QuantiFERON TB2 Ag Value: 0 IU/mL

## 2021-11-22 LAB — QUANTIFERON-TB GOLD PLUS: QuantiFERON-TB Gold Plus: NEGATIVE

## 2022-07-20 LAB — COLOGUARD: COLOGUARD: NEGATIVE

## 2022-07-20 LAB — EXTERNAL GENERIC LAB PROCEDURE: COLOGUARD: NEGATIVE

## 2022-09-23 ENCOUNTER — Inpatient Hospital Stay: Payer: BLUE CROSS/BLUE SHIELD | Attending: Oncology

## 2022-09-23 ENCOUNTER — Other Ambulatory Visit: Payer: BLUE CROSS/BLUE SHIELD

## 2022-09-23 DIAGNOSIS — D472 Monoclonal gammopathy: Secondary | ICD-10-CM | POA: Insufficient documentation

## 2022-09-23 DIAGNOSIS — D509 Iron deficiency anemia, unspecified: Secondary | ICD-10-CM | POA: Insufficient documentation

## 2022-09-23 DIAGNOSIS — D500 Iron deficiency anemia secondary to blood loss (chronic): Secondary | ICD-10-CM

## 2022-09-23 LAB — CBC WITH DIFFERENTIAL/PLATELET
Abs Immature Granulocytes: 0.02 10*3/uL (ref 0.00–0.07)
Basophils Absolute: 0.1 10*3/uL (ref 0.0–0.1)
Basophils Relative: 1 %
Eosinophils Absolute: 0.5 10*3/uL (ref 0.0–0.5)
Eosinophils Relative: 5 %
HCT: 40 % (ref 36.0–46.0)
Hemoglobin: 13.5 g/dL (ref 12.0–15.0)
Immature Granulocytes: 0 %
Lymphocytes Relative: 29 %
Lymphs Abs: 3 10*3/uL (ref 0.7–4.0)
MCH: 29.2 pg (ref 26.0–34.0)
MCHC: 33.8 g/dL (ref 30.0–36.0)
MCV: 86.4 fL (ref 80.0–100.0)
Monocytes Absolute: 0.7 10*3/uL (ref 0.1–1.0)
Monocytes Relative: 7 %
Neutro Abs: 6.1 10*3/uL (ref 1.7–7.7)
Neutrophils Relative %: 58 %
Platelets: 445 10*3/uL — ABNORMAL HIGH (ref 150–400)
RBC: 4.63 MIL/uL (ref 3.87–5.11)
RDW: 13.9 % (ref 11.5–15.5)
WBC: 10.3 10*3/uL (ref 4.0–10.5)
nRBC: 0 % (ref 0.0–0.2)

## 2022-09-23 LAB — FERRITIN: Ferritin: 165 ng/mL (ref 11–307)

## 2022-09-24 LAB — KAPPA/LAMBDA LIGHT CHAINS
Kappa free light chain: 41.6 mg/L — ABNORMAL HIGH (ref 3.3–19.4)
Kappa, lambda light chain ratio: 1.76 — ABNORMAL HIGH (ref 0.26–1.65)
Lambda free light chains: 23.7 mg/L (ref 5.7–26.3)

## 2022-09-24 LAB — IGG, IGA, IGM
IgA: 475 mg/dL — ABNORMAL HIGH (ref 87–352)
IgG (Immunoglobin G), Serum: 1802 mg/dL — ABNORMAL HIGH (ref 586–1602)
IgM (Immunoglobulin M), Srm: 75 mg/dL (ref 26–217)

## 2022-09-25 LAB — PROTEIN ELECTROPHORESIS, SERUM
A/G Ratio: 1 (ref 0.7–1.7)
Albumin ELP: 3.7 g/dL (ref 2.9–4.4)
Alpha-1-Globulin: 0.3 g/dL (ref 0.0–0.4)
Alpha-2-Globulin: 0.8 g/dL (ref 0.4–1.0)
Beta Globulin: 1.2 g/dL (ref 0.7–1.3)
Gamma Globulin: 1.5 g/dL (ref 0.4–1.8)
Globulin, Total: 3.8 g/dL (ref 2.2–3.9)
M-Spike, %: 0.5 g/dL — ABNORMAL HIGH
Total Protein ELP: 7.5 g/dL (ref 6.0–8.5)

## 2022-09-26 ENCOUNTER — Inpatient Hospital Stay (HOSPITAL_BASED_OUTPATIENT_CLINIC_OR_DEPARTMENT_OTHER): Payer: BLUE CROSS/BLUE SHIELD | Admitting: Nurse Practitioner

## 2022-09-26 DIAGNOSIS — D472 Monoclonal gammopathy: Secondary | ICD-10-CM

## 2022-09-26 DIAGNOSIS — D500 Iron deficiency anemia secondary to blood loss (chronic): Secondary | ICD-10-CM

## 2022-09-26 NOTE — Progress Notes (Signed)
[facility]  Telephone:([phone]  Fax:([phone]   Barbara Juarez DOB: [birth year]  MR#: [id number]  QIO#:[id number]  Patient Care Team: Doren Custard, FNP as PCP - General (Family Medicine)  I connected with Barbara Juarez on 09/26/22 at  3:00 PM EDT by video enabled telemedicine visit and verified that I am speaking with the correct person using two identifiers.   I discussed the limitations, risks, security and privacy concerns of performing an evaluation and management service by telemedicine and the availability of in-person appointments. I also discussed with the patient that there may be a patient responsible charge related to this service. The patient expressed understanding and agreed to proceed.   Other persons participating in the visit and their role in the encounter: none  Patient's location: Home Provider's location: Clinic  CHIEF COMPLAINT:  Iron deficiency anemia, MGUS  INTERVAL HISTORY: Patient agrees to telemedicine for yearly evaluation and discussion of her lab results for history of iron deficiency and MGUS. She continues to feel well and denies new bone pain. Denies any neurologic complaints. Denies recent fevers or illnesses. Denies any easy bleeding or bruising. No melena or hematochezia. No pica or restless leg. Reports good appetite and denies weight loss. Denies chest pain. Denies any nausea, vomiting, constipation, or diarrhea. Denies urinary complaints. Patient offers no further specific complaints today.   REVIEW OF SYSTEMS:   Review of Systems  Constitutional: Negative.  Negative for fever, malaise/fatigue and weight loss.  Respiratory: Negative.  Negative for cough and shortness of breath.   Cardiovascular: Negative.  Negative for chest pain and leg swelling.  Gastrointestinal: Negative.  Negative for abdominal pain, blood in stool and melena.  Genitourinary: Negative.  Negative for hematuria.  Musculoskeletal: Negative.   Negative for back pain.  Skin: Negative.  Negative for rash.  Neurological: Negative.  Negative for sensory change, focal weakness, weakness and headaches.  Psychiatric/Behavioral: Negative.  The patient is not nervous/anxious.   As per HPI. Otherwise, a complete review of systems is negative.  PAST MEDICAL HISTORY: Past Medical History:  Diagnosis Date   Anemia    Anemia associated with acute blood loss 05/14/2018   Fibroid uterus 05/20/2018   Fibroids    History of blood transfusion    Hypertension    Menorrhagia with irregular cycle 05/14/2018    PAST SURGICAL HISTORY: Past Surgical History:  Procedure Laterality Date   ABDOMINAL HYSTERECTOMY  09/11/2018   CESAREAN SECTION  1999   CESAREAN SECTION  07/1991   DILATATION & CURETTAGE/HYSTEROSCOPY WITH MYOSURE N/A 05/21/2018   Procedure: DILATATION & CURETTAGE/HYSTEROSCOPY WITH MYOSURE ENDO CERVICAL POLYPECTOMY;  Surgeon: Conard Novak, MD;  Location: [facility] ORS;  Service: Gynecology;  Laterality: N/A;    FAMILY HISTORY Family History  Problem Relation Age of Onset   Diabetes Mother    Breast cancer Neg Hx     No obstetric history on file.   HEALTH MAINTENANCE: Social History   Tobacco Use   Smoking status: Never   Smokeless tobacco: Never  Vaping Use   Vaping Use: Never used  Substance Use Topics   Alcohol use: Yes    Alcohol/week: 1.0 standard drink of alcohol    Types: 1 Glasses of wine per week    Comment: once a month   Drug use: No    No Known Allergies  Current Outpatient Medications  Medication Sig Dispense Refill   atorvastatin (LIPITOR) 20 MG tablet Take 1 tablet (20 mg total) by mouth daily. 90  tablet 1   blood glucose meter kit and supplies KIT Dispense based on patient and insurance preference. Check fasting blood sugar once daily as directed. 1 each 0   famotidine (PEPCID) 40 MG tablet Take 1 tablet (40 mg total) by mouth daily. 90 tablet 1   ferrous sulfate 325 (65 FE) MG EC tablet Take  325 mg by mouth 3 (three) times daily with meals.     glucose blood test strip Use as instructed 100 each 12   Lancets (ACCU-CHEK MULTICLIX) lancets Use as instructed 100 each 12   metFORMIN (GLUCOPHAGE) 500 MG tablet Take 1 tablet (500 mg total) by mouth daily with breakfast for 7 days, THEN 1 tablet (500 mg total) 2 (two) times daily with a meal. 180 tablet 1   No current facility-administered medications for this visit.    OBJECTIVE: LMP 05/06/2018 (Approximate) Comment: neg hcg   There is no height or weight on file to calculate BMI.    ECOG FS:0 - Asymptomatic  General: Well-developed, well-nourished, no acute distress. HEENT: Normocephalic. Neuro: Alert, answering all questions appropriately. Cranial nerves grossly intact. Psych: Normal affect.  LAB RESULTS:     Latest Ref Rng & Units 09/23/2022    3:00 PM 09/18/2021    3:38 PM 09/18/2020    2:34 PM  CBC  WBC 4.0 - 10.5 K/uL 10.3  8.9  9.3   Hemoglobin 12.0 - 15.0 g/dL 16.1  09.6  04.5   Hematocrit 36.0 - 46.0 % 40.0  38.2  38.8   Platelets 150 - 400 K/uL 445  380  446    Lab Results  Component Value Date   TOTALPROTELP 7.5 09/23/2022   ALBUMINELP 3.7 09/23/2022   A1GS 0.3 09/23/2022   A2GS 0.8 09/23/2022   BETS 1.2 09/23/2022   GAMS 1.5 09/23/2022   MSPIKE 0.5 (H) 09/23/2022   SPEI Comment 09/23/2022   Lab Results  Component Value Date   IRON 57 09/18/2021   TIBC 305 09/18/2021   IRONPCTSAT 19 09/18/2021   Lab Results  Component Value Date   FERRITIN 165 09/23/2022      STUDIES: No results found.  ASSESSMENT:  Iron deficiency anemia, MGUS.  PLAN:   1. Iron deficiency anemia: Resolved with hysterectomy.  Patient's hemoglobin and iron stores continue to be within normal limits.  No intervention is needed at this time.  Return to clinic in 1 year with repeat laboratory work and routine evaluation.    2. MGUS: Chronic and unchanged.  Patient's M spike has ranged from 0.6-1.0 since August 2016.  Her most  recent result is 0.5.  She has an elevated IgG level of 1802 which is unchanged.  Kappa chains are elevated at 41.6 with slightly elevated but stable kappa lambda light chain ratio. Patients with an IgG prominence are less likely to progress to overt multiple myeloma.  She has no evidence of endorgan damage.  She does not require metastatic bone survey or bone marrow biopsy at this time.  If patient's laboratory work remains stable for a total of 10 years in August 2026, can likely stop monitoring and patient can be discharged from clinic.  No intervention is needed at this time.  Return to clinic in 1 year with repeat laboratory work and video assisted telemedicine.   Disposition:  1 year- lab (cbc, ferritin, iron studies, cmp, spep, kllc, immunoglobulins) 2 weeks later- see Dr Orlie Dakin or myself virtually for discussion of results- la  I provided 15 minutes of face-to-face video  visit time during this encounter which included chart review, counseling, and coordination of care as documented above.  Patient expressed understanding and was in agreement with this plan. She also understands that She can call clinic at any time with any questions, concerns, or complaints.   Alinda Dooms, NP  09/26/2022

## 2022-10-31 ENCOUNTER — Encounter: Payer: Self-pay | Admitting: Oncology

## 2022-10-31 ENCOUNTER — Encounter: Payer: Self-pay | Admitting: Nurse Practitioner

## 2023-09-05 ENCOUNTER — Encounter: Payer: Self-pay | Admitting: Oncology

## 2023-09-11 ENCOUNTER — Other Ambulatory Visit: Payer: Self-pay | Admitting: *Deleted

## 2023-09-11 DIAGNOSIS — D500 Iron deficiency anemia secondary to blood loss (chronic): Secondary | ICD-10-CM

## 2023-09-11 DIAGNOSIS — D472 Monoclonal gammopathy: Secondary | ICD-10-CM

## 2023-09-12 ENCOUNTER — Inpatient Hospital Stay: Payer: Self-pay | Attending: Oncology

## 2023-09-12 DIAGNOSIS — D500 Iron deficiency anemia secondary to blood loss (chronic): Secondary | ICD-10-CM

## 2023-09-12 DIAGNOSIS — D472 Monoclonal gammopathy: Secondary | ICD-10-CM | POA: Insufficient documentation

## 2023-09-12 DIAGNOSIS — D509 Iron deficiency anemia, unspecified: Secondary | ICD-10-CM | POA: Insufficient documentation

## 2023-09-12 LAB — CBC WITH DIFFERENTIAL/PLATELET
Abs Immature Granulocytes: 0.03 10*3/uL (ref 0.00–0.07)
Basophils Absolute: 0.1 10*3/uL (ref 0.0–0.1)
Basophils Relative: 1 %
Eosinophils Absolute: 0.3 10*3/uL (ref 0.0–0.5)
Eosinophils Relative: 3 %
HCT: 36.6 % (ref 36.0–46.0)
Hemoglobin: 12.4 g/dL (ref 12.0–15.0)
Immature Granulocytes: 0 %
Lymphocytes Relative: 29 %
Lymphs Abs: 2.5 10*3/uL (ref 0.7–4.0)
MCH: 29.9 pg (ref 26.0–34.0)
MCHC: 33.9 g/dL (ref 30.0–36.0)
MCV: 88.2 fL (ref 80.0–100.0)
Monocytes Absolute: 0.5 10*3/uL (ref 0.1–1.0)
Monocytes Relative: 6 %
Neutro Abs: 5.2 10*3/uL (ref 1.7–7.7)
Neutrophils Relative %: 61 %
Platelets: 377 10*3/uL (ref 150–400)
RBC: 4.15 MIL/uL (ref 3.87–5.11)
RDW: 13.8 % (ref 11.5–15.5)
WBC: 8.6 10*3/uL (ref 4.0–10.5)
nRBC: 0 % (ref 0.0–0.2)

## 2023-09-12 LAB — FERRITIN: Ferritin: 158 ng/mL (ref 11–307)

## 2023-09-14 LAB — IGG, IGA, IGM
IgA: 454 mg/dL — ABNORMAL HIGH (ref 87–352)
IgG (Immunoglobin G), Serum: 1672 mg/dL — ABNORMAL HIGH (ref 586–1602)
IgM (Immunoglobulin M), Srm: 66 mg/dL (ref 26–217)

## 2023-09-15 LAB — KAPPA/LAMBDA LIGHT CHAINS
Kappa free light chain: 30.5 mg/L — ABNORMAL HIGH (ref 3.3–19.4)
Kappa, lambda light chain ratio: 1.55 (ref 0.26–1.65)
Lambda free light chains: 19.7 mg/L (ref 5.7–26.3)

## 2023-09-15 LAB — PROTEIN ELECTROPHORESIS, SERUM
A/G Ratio: 0.9 (ref 0.7–1.7)
Albumin ELP: 3.5 g/dL (ref 2.9–4.4)
Alpha-1-Globulin: 0.2 g/dL (ref 0.0–0.4)
Alpha-2-Globulin: 0.8 g/dL (ref 0.4–1.0)
Beta Globulin: 1.3 g/dL (ref 0.7–1.3)
Gamma Globulin: 1.5 g/dL (ref 0.4–1.8)
Globulin, Total: 3.7 g/dL (ref 2.2–3.9)
M-Spike, %: 0.5 g/dL — ABNORMAL HIGH
Total Protein ELP: 7.2 g/dL (ref 6.0–8.5)

## 2023-09-26 ENCOUNTER — Inpatient Hospital Stay (HOSPITAL_BASED_OUTPATIENT_CLINIC_OR_DEPARTMENT_OTHER): Payer: Self-pay | Admitting: Oncology

## 2023-09-26 DIAGNOSIS — D472 Monoclonal gammopathy: Secondary | ICD-10-CM

## 2023-09-26 NOTE — Progress Notes (Signed)
 [facility]  [phone]  [phone]     Barbara Juarez DOB: 12-11-1970  MR#: [id number]  XLK#:[id number]  Date of service 02/22/2015.  Patient Care Team: Doren Custard, FNP as PCP - General (Family Medicine) Jeralyn Ruths, MD as Consulting Physician (Oncology)  I connected with [name] on 09/26/23 at 11:15 AM EDT by video enabled telemedicine visit and verified that I am speaking with the correct person using two identifiers.   I discussed the limitations, risks, security and privacy concerns of performing an evaluation and management service by telemedicine and the availability of in-person appointments. I also discussed with the patient that there may be a patient responsible charge related to this service. The patient expressed understanding and agreed to proceed.   Other persons participating in the visit and their role in the encounter: Patient, MD.  Patient's location: Home. Provider's location: Clinic.  CHIEF COMPLAINT:  MGUS.  INTERVAL HISTORY: Patient agreed to video-assisted telemedicine visit for yearly evaluation and discussion of her laboratory results.  She continues to feel well and remains asymptomatic.  She has no neurologic complaints. She denies any recent fevers or illnesses.  She denies any chest pain, shortness of breath, cough, or hemoptysis.  She denies any nausea, vomiting, constipation, or diarrhea. She has no melena or hematochezia.  She has no urinary complaints.  Patient offers no specific complaints today.  REVIEW OF SYSTEMS:   Review of Systems  Constitutional: Negative.  Negative for fever, malaise/fatigue and weight loss.  Respiratory: Negative.  Negative for cough and shortness of breath.   Cardiovascular: Negative.  Negative for chest pain and leg swelling.  Gastrointestinal: Negative.  Negative for abdominal pain, blood in stool and melena.  Genitourinary: Negative.  Negative for hematuria.   Musculoskeletal: Negative.  Negative for back pain.  Skin: Negative.  Negative for rash.  Neurological: Negative.  Negative for sensory change, focal weakness, weakness and headaches.  Psychiatric/Behavioral: Negative.  The patient is not nervous/anxious.     As per HPI. Otherwise, a complete review of systems is negative.  PAST MEDICAL HISTORY: Past Medical History:  Diagnosis Date   Anemia    Anemia associated with acute blood loss 05/14/2018   Fibroid uterus 05/20/2018   Fibroids    History of blood transfusion    Hypertension    Menorrhagia with irregular cycle 05/14/2018    PAST SURGICAL HISTORY: Past Surgical History:  Procedure Laterality Date   ABDOMINAL HYSTERECTOMY  09/11/2018   CESAREAN SECTION  1999   CESAREAN SECTION  07/1991   DILATATION & CURETTAGE/HYSTEROSCOPY WITH MYOSURE N/A 05/21/2018   Procedure: DILATATION & CURETTAGE/HYSTEROSCOPY WITH MYOSURE ENDO CERVICAL POLYPECTOMY;  Surgeon: Conard Novak, MD;  Location: [facility] ORS;  Service: Gynecology;  Laterality: N/A;    FAMILY HISTORY Family History  Problem Relation Age of Onset   Diabetes Mother    Breast cancer Neg Hx     GYNECOLOGIC HISTORY:  09/07/2015    HEALTH MAINTENANCE: Social History   Tobacco Use   Smoking status: Never   Smokeless tobacco: Never  Vaping Use   Vaping status: Never Used  Substance Use Topics   Alcohol use: Yes    Alcohol/week: 1.0 standard drink of alcohol    Types: 1 Glasses of wine per week    Comment: once a month   Drug use: No    No Known Allergies  Current Outpatient Medications  Medication Sig Dispense Refill   atorvastatin (LIPITOR) 20 MG tablet Take  1 tablet (20 mg total) by mouth daily. 90 tablet 1   blood glucose meter kit and supplies KIT Dispense based on patient and insurance preference. Check fasting blood sugar once daily as directed. 1 each 0   famotidine (PEPCID) 40 MG tablet Take 1 tablet (40 mg total) by mouth daily. 90 tablet 1   glucose  blood test strip Use as instructed 100 each 12   Lancets (ACCU-CHEK MULTICLIX) lancets Use as instructed 100 each 12   metFORMIN (GLUCOPHAGE) 500 MG tablet Take 1 tablet (500 mg total) by mouth daily with breakfast for 7 days, THEN 1 tablet (500 mg total) 2 (two) times daily with a meal. 180 tablet 1   No current facility-administered medications for this visit.    OBJECTIVE: LMP 05/06/2018 (Approximate) Comment: neg hcg   There is no height or weight on file to calculate BMI.    ECOG FS:0 - Asymptomatic  General: Well-developed, well-nourished, no acute distress. HEENT: Normocephalic. Neuro: Alert, answering all questions appropriately. Cranial nerves grossly intact. Psych: Normal affect.  LAB RESULTS:  CBC    Component Value Date/Time   WBC 8.6 09/12/2023 0917   RBC 4.15 09/12/2023 0917   HGB 12.4 09/12/2023 0917   HGB 10.6 (L) 09/23/2014 0820   HCT 36.6 09/12/2023 0917   HCT 33.2 (L) 09/23/2014 0820   PLT 377 09/12/2023 0917   PLT 420 09/23/2014 0820   MCV 88.2 09/12/2023 0917   MCV 87 09/23/2014 0820   MCH 29.9 09/12/2023 0917   MCHC 33.9 09/12/2023 0917   RDW 13.8 09/12/2023 0917   RDW 16.4 (H) 09/23/2014 0820   LYMPHSABS 2.5 09/12/2023 0917   LYMPHSABS 2.2 09/23/2014 0820   MONOABS 0.5 09/12/2023 0917   MONOABS 0.5 09/23/2014 0820   EOSABS 0.3 09/12/2023 0917   EOSABS 0.4 09/23/2014 0820   BASOSABS 0.1 09/12/2023 0917   BASOSABS 0.1 09/23/2014 0820     Lab Results  Component Value Date   TOTALPROTELP 7.2 09/12/2023   ALBUMINELP 3.5 09/12/2023   A1GS 0.2 09/12/2023   A2GS 0.8 09/12/2023   BETS 1.3 09/12/2023   GAMS 1.5 09/12/2023   MSPIKE 0.5 (H) 09/12/2023   SPEI Comment 09/12/2023    Lab Results  Component Value Date   IRON 57 09/18/2021   TIBC 305 09/18/2021   IRONPCTSAT 19 09/18/2021   Lab Results  Component Value Date   FERRITIN 158 09/12/2023      STUDIES: No results found.  ASSESSMENT:   MGUS.  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  Patient's M spike has ranged between 0.5 and 1.0 since August 2016.  Her most recent result is 0.5.  Over the same timeframe, she has had a mildly elevated IgG level ranging between 1672 and 1972.  Her most recent result is 69.  Kappa free light chains have ranged between 30.5 and 48.3.  Her most recent result is 30.5.  She has no evidence of endorgan damage.  No intervention is needed.  Patient does not require bone marrow biopsy or imaging.  Patient will have laboratory work and video-assisted telemedicine visit in 1 year.  This will be 10 years of monitoring her laboratory work and if everything remains stable, she likely can be discharged from clinic. History of iron deficiency anemia: Resolved with hysterectomy.  Hemoglobin continues to be within normal limits.  I provided 20 minutes of face-to-face video visit time during this encounter which included chart review, counseling, and coordination of care as documented above.  Patient expressed understanding and was in agreement with this plan. She also understands that She can call clinic at any time with any questions, concerns, or complaints.    Jeralyn Ruths, MD   09/26/2023 11:21 AM

## 2023-09-29 ENCOUNTER — Encounter: Payer: Self-pay | Admitting: Oncology

## 2023-10-20 ENCOUNTER — Ambulatory Visit: Payer: Self-pay | Admitting: Physician Assistant

## 2023-10-20 ENCOUNTER — Encounter: Payer: Self-pay | Admitting: Physician Assistant

## 2023-10-20 VITALS — BP 122/80 | HR 77 | Temp 98.3°F | Resp 16 | Ht 64.0 in | Wt 283.0 lb

## 2023-10-20 DIAGNOSIS — E1165 Type 2 diabetes mellitus with hyperglycemia: Secondary | ICD-10-CM

## 2023-10-20 DIAGNOSIS — Z7689 Persons encountering health services in other specified circumstances: Secondary | ICD-10-CM

## 2023-10-20 DIAGNOSIS — E785 Hyperlipidemia, unspecified: Secondary | ICD-10-CM

## 2023-10-20 DIAGNOSIS — E1169 Type 2 diabetes mellitus with other specified complication: Secondary | ICD-10-CM

## 2023-10-20 DIAGNOSIS — I10 Essential (primary) hypertension: Secondary | ICD-10-CM

## 2023-10-20 DIAGNOSIS — E119 Type 2 diabetes mellitus without complications: Secondary | ICD-10-CM

## 2023-10-20 DIAGNOSIS — Z1329 Encounter for screening for other suspected endocrine disorder: Secondary | ICD-10-CM

## 2023-10-20 DIAGNOSIS — R5383 Other fatigue: Secondary | ICD-10-CM

## 2023-10-20 DIAGNOSIS — Z1231 Encounter for screening mammogram for malignant neoplasm of breast: Secondary | ICD-10-CM

## 2023-10-20 LAB — POCT GLYCOSYLATED HEMOGLOBIN (HGB A1C): Hemoglobin A1C: 5.5 % (ref 4.0–5.6)

## 2023-10-20 MED ORDER — ATORVASTATIN CALCIUM 20 MG PO TABS: 20.0000 mg | ORAL_TABLET | Freq: Every day | ORAL | 1 refills | Status: DC

## 2023-10-20 MED ORDER — LISINOPRIL 5 MG PO TABS: 5.0000 mg | ORAL_TABLET | Freq: Every day | ORAL | 1 refills | Status: DC

## 2023-10-20 NOTE — Progress Notes (Signed)
 [facility] [address] [place], [place] [address]  Internal MEDICINE  Office Visit Note  Patient Name: Barbara Juarez  [id number]  [id number]  Date of Service: 11/02/2023   Complaints/HPI Pt is here for establishment of PCP. Chief Complaint  Patient presents with   New Patient (Initial Visit)   Hypertension   Diabetes   Hyperlipidemia   Quality Metric Gaps    Colonoscopy, Mammogram   HPI Pt is here to establish care -Last PCP office closed -taking metformin  1 in Am and 2 at night, will decrease to 1 tab daily given A1c today -Hx of HTN and takes medication for this -Seeing oncology for MGUS, seeing annually.  -due for mammogram -cologuard done previously -no more paps, hysterectomy -Does follow with ophthalmology, hx of uveitis, is on eye drops. Cataract surgery and eye lid procedure last year. Goes every 6 months.  -married, 2 grown kids. Lives with spouse and dog.  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 M-F. -never been a smoker, occasional alcohol use. No other substance use -mix of home cooked and eating out  Current Medication: Outpatient Encounter Medications as of 10/20/2023  Medication Sig   blood glucose meter kit and supplies KIT Dispense based on patient and insurance preference. Check fasting blood sugar once daily as directed.   brimonidine-timolol (COMBIGAN) 0.2-0.5 % ophthalmic solution Place 1 drop into both eyes every 12 (twelve) hours.   Difluprednate 0.05 % EMUL Apply 0.05 mLs to eye 2 times daily at 12 noon and 4 pm.   famotidine  (PEPCID ) 40 MG tablet Take 1 tablet (40 mg total) by mouth daily.   glucose blood test strip Use as instructed   hydrochlorothiazide  (HYDRODIURIL ) 25 MG tablet Take 25 mg by mouth daily.   Lancets (ACCU-CHEK MULTICLIX) lancets Use as instructed   [DISCONTINUED] atorvastatin  (LIPITOR) 20 MG tablet Take 1 tablet (20 mg total) by mouth daily.   [DISCONTINUED] lisinopril   (ZESTRIL ) 5 MG tablet Take 5 mg by mouth daily.   atorvastatin  (LIPITOR) 20 MG tablet Take 1 tablet (20 mg total) by mouth daily.   lisinopril  (ZESTRIL ) 5 MG tablet Take 1 tablet (5 mg total) by mouth daily.   metFORMIN  (GLUCOPHAGE ) 500 MG tablet Take 1 tablet (500 mg total) by mouth daily with breakfast for 7 days, THEN 1 tablet (500 mg total) 2 (two) times daily with a meal.   No facility-administered encounter medications on file as of 10/20/2023.    Surgical History: Past Surgical History:  Procedure Laterality Date   ABDOMINAL HYSTERECTOMY  09/11/2018   CESAREAN SECTION  1999   CESAREAN SECTION  07/1991   DILATATION & CURETTAGE/HYSTEROSCOPY WITH MYOSURE N/A 05/21/2018   Procedure: DILATATION & CURETTAGE/HYSTEROSCOPY WITH MYOSURE ENDO CERVICAL POLYPECTOMY;  Surgeon: Kris Pester, MD;  Location: [facility] ORS;  Service: Gynecology;  Laterality: N/A;    Medical History: Past Medical History:  Diagnosis Date   Diabetes mellitus without complication (HCC)    Fibroid uterus 05/20/2018   Fibroids    History of blood transfusion    Hyperlipidemia    Hypertension    Menorrhagia with irregular cycle 05/14/2018    Family History: Family History  Problem Relation Age of Onset   Diabetes Mother    Breast cancer Neg Hx     Social History   Socioeconomic History   Marital status: Married    Spouse name: Therapist, sports   Number of children: 2   Years of education: Not on file  Highest education level: Not on file  Occupational History   Not on file  Tobacco Use   Smoking status: Never   Smokeless tobacco: Never  Vaping Use   Vaping status: Never Used  Substance and Sexual Activity   Alcohol use: Yes    Alcohol/week: 1.0 standard drink of alcohol    Types: 1 Glasses of wine per week    Comment: once a month   Drug use: No   Sexual activity: Yes    Partners: Male    Birth control/protection: Surgical  Other Topics Concern   Not on file  Social History Narrative   Not on  file   Social Drivers of Health   Financial Resource Strain: Low Risk  (07/16/2018)   Overall Financial Resource Strain (CARDIA)    Difficulty of Paying Living Expenses: Not hard at all  Food Insecurity: No Food Insecurity (07/16/2018)   Hunger Vital Sign    Worried About Running Out of Food in the Last Year: Never true    Ran Out of Food in the Last Year: Never true  Transportation Needs: No Transportation Needs (07/16/2018)   PRAPARE - Administrator, Civil Service (Medical): No    Lack of Transportation (Non-Medical): No  Physical Activity: Sufficiently Active (07/16/2018)   Exercise Vital Sign    Days of Exercise per Week: 5 days    Minutes of Exercise per Session: 30 min  Stress: No Stress Concern Present (07/16/2018)   [employer] of Occupational Health - Occupational Stress Questionnaire    Feeling of Stress : Not at all  Social Connections: Moderately Integrated (07/16/2018)   Social Connection and Isolation Panel [NHANES]    Frequency of Communication with Friends and Family: More than three times a week    Frequency of Social Gatherings with Friends and Family: More than three times a week    Attends Religious Services: More than 4 times per year    Active Member of [employer] or Organizations: No    Attends Banker Meetings: Never    Marital Status: Married  Catering manager Violence: Not At Risk (07/16/2018)   Humiliation, Afraid, Rape, and Kick questionnaire    Fear of Current or Ex-Partner: No    Emotionally Abused: No    Physically Abused: No    Sexually Abused: No     Review of Systems  Constitutional:  Negative for chills and unexpected weight change.  HENT:  Positive for postnasal drip. Negative for congestion, rhinorrhea, sneezing and sore throat.   Respiratory:  Negative for cough, chest tightness and shortness of breath.   Cardiovascular:  Negative for chest pain and palpitations.  Gastrointestinal:  Negative for abdominal pain,  constipation, diarrhea, nausea and vomiting.  Genitourinary:  Negative for dysuria and frequency.  Musculoskeletal:  Negative for arthralgias, back pain, joint swelling and neck pain.  Skin:  Negative for rash.  Neurological: Negative.  Negative for tremors and numbness.  Hematological:  Negative for adenopathy. Does not bruise/bleed easily.  Psychiatric/Behavioral:  Negative for behavioral problems (Depression), sleep disturbance and suicidal ideas. The patient is not nervous/anxious.     Vital Signs: BP 122/80   Pulse 77   Temp 98.3 F (36.8 C)   Resp 16   Ht 5\' 4"  (1.626 m)   Wt 283 lb (128.4 kg)   LMP 05/06/2018 (Approximate) Comment: neg hcg  SpO2 97%   BMI 48.58 kg/m    Physical Exam Vitals and nursing note reviewed.  Constitutional:  General: She is not in acute distress.    Appearance: Normal appearance. She is well-developed. She is not diaphoretic.  HENT:     Head: Normocephalic and atraumatic.  Eyes:     Pupils: Pupils are equal, round, and reactive to light.  Neck:     Thyroid: No thyromegaly.     Vascular: No JVD.     Trachea: No tracheal deviation.  Cardiovascular:     Rate and Rhythm: Normal rate and regular rhythm.     Heart sounds: Normal heart sounds. No murmur heard.    No friction rub. No gallop.  Pulmonary:     Effort: Pulmonary effort is normal. No respiratory distress.     Breath sounds: No wheezing or rales.  Chest:     Chest wall: No tenderness.  Musculoskeletal:        General: Normal range of motion.  Skin:    General: Skin is warm and dry.  Neurological:     Mental Status: She is alert.  Psychiatric:        Behavior: Behavior normal.        Thought Content: Thought content normal.        Judgment: Judgment normal.       Assessment/Plan: 1. Type 2 diabetes mellitus with hyperglycemia, without long-term current use of insulin (HCC) (Primary) - POCT HgB A1C is 5.5 which is normal, will reduce metformin  to 1 tab BID and  monitor - atorvastatin  (LIPITOR) 20 MG tablet; Take 1 tablet (20 mg total) by mouth daily.  Dispense: 90 tablet; Refill: 1  2. Essential hypertension Well controlled, continue current medication - lisinopril  (ZESTRIL ) 5 MG tablet; Take 1 tablet (5 mg total) by mouth daily.  Dispense: 90 tablet; Refill: 1  3. Hyperlipidemia associated with type 2 diabetes mellitus (HCC) Will check labs - atorvastatin  (LIPITOR) 20 MG tablet; Take 1 tablet (20 mg total) by mouth daily.  Dispense: 90 tablet; Refill: 1 - Lipid Panel With LDL/HDL Ratio  4. Visit for screening mammogram - MM 3D SCREENING MAMMOGRAM BILATERAL BREAST; Future  5. Thyroid disorder screen - TSH + free T4  6. Other fatigue - CBC w/Diff/Platelet - Comprehensive metabolic panel with GFR - TSH + free T4 - Lipid Panel With LDL/HDL Ratio  7. Encounter to establish care with new provider Will order labs   General Counseling: Syeda verbalizes understanding of the findings of todays visit and agrees with plan of treatment. I have discussed any further diagnostic evaluation that may be needed or ordered today. We also reviewed her medications today. she has been encouraged to call the office with any questions or concerns that should arise related to todays visit.    Counseling:    Orders Placed This Encounter  Procedures   MM 3D SCREENING MAMMOGRAM BILATERAL BREAST   CBC w/Diff/Platelet   Comprehensive metabolic panel with GFR   TSH + free T4   Lipid Panel With LDL/HDL Ratio   POCT HgB A1C    Meds ordered this encounter  Medications   lisinopril  (ZESTRIL ) 5 MG tablet    Sig: Take 1 tablet (5 mg total) by mouth daily.    Dispense:  90 tablet    Refill:  1   atorvastatin  (LIPITOR) 20 MG tablet    Sig: Take 1 tablet (20 mg total) by mouth daily.    Dispense:  90 tablet    Refill:  1     This patient was seen by Taylor Favia, PA-C in collaboration with Dr. Fozia  Meredeth Stallion as a part of collaborative care  agreement.   Time spent:35 Minutes

## 2023-11-05 LAB — CBC WITH DIFFERENTIAL/PLATELET
Basophils Absolute: 0.1 10*3/uL (ref 0.0–0.2)
Basos: 1 %
EOS (ABSOLUTE): 0.3 10*3/uL (ref 0.0–0.4)
Eos: 4 %
Hematocrit: 37.6 % (ref 34.0–46.6)
Hemoglobin: 12.2 g/dL (ref 11.1–15.9)
Immature Grans (Abs): 0 10*3/uL (ref 0.0–0.1)
Immature Granulocytes: 0 %
Lymphocytes Absolute: 2.4 10*3/uL (ref 0.7–3.1)
Lymphs: 31 %
MCH: 29.5 pg (ref 26.6–33.0)
MCHC: 32.4 g/dL (ref 31.5–35.7)
MCV: 91 fL (ref 79–97)
Monocytes Absolute: 0.4 10*3/uL (ref 0.1–0.9)
Monocytes: 6 %
Neutrophils Absolute: 4.6 10*3/uL (ref 1.4–7.0)
Neutrophils: 58 %
Platelets: 408 10*3/uL (ref 150–450)
RBC: 4.14 x10E6/uL (ref 3.77–5.28)
RDW: 13.5 % (ref 11.7–15.4)
WBC: 7.8 10*3/uL (ref 3.4–10.8)

## 2023-11-05 LAB — LIPID PANEL WITH LDL/HDL RATIO
Cholesterol, Total: 162 mg/dL (ref 100–199)
HDL: 46 mg/dL (ref >39)
LDL Chol Calc (NIH): 86 mg/dL (ref 0–99)
LDL/HDL Ratio: 1.9 ratio (ref 0.0–3.2)
Triglycerides: 177 mg/dL — ABNORMAL HIGH (ref 0–149)
VLDL Cholesterol Cal: 30 mg/dL (ref 5–40)

## 2023-11-05 LAB — TSH+FREE T4
Free T4: 1.01 ng/dL (ref 0.82–1.77)
TSH: 1.9 u[IU]/mL (ref 0.450–4.500)

## 2023-11-05 LAB — COMPREHENSIVE METABOLIC PANEL WITH GFR
ALT: 12 IU/L (ref 0–32)
AST: 15 IU/L (ref 0–40)
Albumin: 4 g/dL (ref 3.9–4.9)
Alkaline Phosphatase: 114 IU/L (ref 44–121)
BUN/Creatinine Ratio: 21 (ref 9–23)
BUN: 14 mg/dL (ref 6–24)
Bilirubin Total: 0.4 mg/dL (ref 0.0–1.2)
CO2: 26 mmol/L (ref 20–29)
Calcium: 9.3 mg/dL (ref 8.7–10.2)
Chloride: 97 mmol/L (ref 96–106)
Creatinine, Ser: 0.66 mg/dL (ref 0.57–1.00)
Globulin, Total: 3.2 g/dL (ref 1.5–4.5)
Glucose: 124 mg/dL — ABNORMAL HIGH (ref 70–99)
Potassium: 3.5 mmol/L (ref 3.5–5.2)
Sodium: 138 mmol/L (ref 134–144)
Total Protein: 7.2 g/dL (ref 6.0–8.5)
eGFR: 107 mL/min/{1.73_m2} (ref >59)

## 2023-11-13 ENCOUNTER — Encounter: Payer: Self-pay | Admitting: Oncology

## 2023-11-20 ENCOUNTER — Encounter: Payer: Self-pay | Admitting: Physician Assistant

## 2023-11-20 ENCOUNTER — Ambulatory Visit (INDEPENDENT_AMBULATORY_CARE_PROVIDER_SITE_OTHER): Payer: Self-pay | Admitting: Physician Assistant

## 2023-11-20 VITALS — BP 138/88 | HR 76 | Temp 98.0°F | Resp 16 | Ht 64.0 in | Wt 287.0 lb

## 2023-11-20 DIAGNOSIS — E1165 Type 2 diabetes mellitus with hyperglycemia: Secondary | ICD-10-CM | POA: Diagnosis not present

## 2023-11-20 DIAGNOSIS — I10 Essential (primary) hypertension: Secondary | ICD-10-CM

## 2023-11-20 DIAGNOSIS — E785 Hyperlipidemia, unspecified: Secondary | ICD-10-CM

## 2023-11-20 DIAGNOSIS — E1169 Type 2 diabetes mellitus with other specified complication: Secondary | ICD-10-CM

## 2023-11-20 DIAGNOSIS — Z0001 Encounter for general adult medical examination with abnormal findings: Secondary | ICD-10-CM

## 2023-11-20 NOTE — Progress Notes (Signed)
 [facility] [address] [place], [place] [address]  Internal MEDICINE  Office Visit Note  Patient Name: Barbara Juarez  [id number]  [id number]  Date of Service: 12/16/2023  Chief Complaint  Patient presents with   Annual Exam   Diabetes   Hypertension   Hyperlipidemia   Sinusitis     HPI Pt is here for routine health maintenance examination -Has a cold and has taken alka seltzer cold plus and ibuprofen . Started Sunday night, was sitting outside and throat got a little scratchy and next morning throat was worse. -will be scheduling mammogram -cologuard last year -states they took her cervix with hysterectomy and does not need to continue pap -labs reviewed: TG elevated, BG elevated, but last A1c back in normal range and will monitor. -Taking 500mg  metformin  BID now and doing well with this. This was a decrease from 3 tabs total daily. -Eye exam UTD  Current Medication: Outpatient Encounter Medications as of 11/20/2023  Medication Sig   atorvastatin  (LIPITOR) 20 MG tablet Take 1 tablet (20 mg total) by mouth daily.   blood glucose meter kit and supplies KIT Dispense based on patient and insurance preference. Check fasting blood sugar once daily as directed.   brimonidine-timolol (COMBIGAN) 0.2-0.5 % ophthalmic solution Place 1 drop into both eyes every 12 (twelve) hours.   Difluprednate 0.05 % EMUL Apply 0.05 mLs to eye 2 times daily at 12 noon and 4 pm.   famotidine  (PEPCID ) 40 MG tablet Take 1 tablet (40 mg total) by mouth daily.   glucose blood test strip Use as instructed   hydrochlorothiazide  (HYDRODIURIL ) 25 MG tablet Take 25 mg by mouth daily.   Lancets (ACCU-CHEK MULTICLIX) lancets Use as instructed   lisinopril  (ZESTRIL ) 5 MG tablet Take 1 tablet (5 mg total) by mouth daily.   metFORMIN  (GLUCOPHAGE ) 500 MG tablet Take 1 tablet (500 mg total) by mouth daily with breakfast for 7 days, THEN 1 tablet (500 mg total) 2 (two) times daily with a meal.   No  facility-administered encounter medications on file as of 11/20/2023.    Surgical History: Past Surgical History:  Procedure Laterality Date   ABDOMINAL HYSTERECTOMY  09/11/2018   CESAREAN SECTION  1999   CESAREAN SECTION  07/1991   DILATATION & CURETTAGE/HYSTEROSCOPY WITH MYOSURE N/A 05/21/2018   Procedure: DILATATION & CURETTAGE/HYSTEROSCOPY WITH MYOSURE ENDO CERVICAL POLYPECTOMY;  Surgeon: Kris Pester, MD;  Location: [facility] ORS;  Service: Gynecology;  Laterality: N/A;    Medical History: Past Medical History:  Diagnosis Date   Diabetes mellitus without complication (HCC)    Fibroid uterus 05/20/2018   Fibroids    History of blood transfusion    Hyperlipidemia    Hypertension    Menorrhagia with irregular cycle 05/14/2018    Family History: Family History  Problem Relation Age of Onset   Diabetes Mother    Breast cancer Neg Hx       Review of Systems  Constitutional:  Negative for chills and unexpected weight change.  HENT:  Positive for congestion, postnasal drip and sore throat. Negative for rhinorrhea.   Respiratory:  Positive for cough. Negative for chest tightness and shortness of breath.   Cardiovascular:  Negative for chest pain and palpitations.  Gastrointestinal:  Negative for abdominal pain, constipation, diarrhea, nausea and vomiting.  Genitourinary:  Negative for dysuria and frequency.  Musculoskeletal:  Negative for arthralgias, back pain, joint swelling and neck pain.  Skin:  Negative for rash.  Neurological: Negative.  Negative for tremors  and numbness.  Hematological:  Negative for adenopathy. Does not bruise/bleed easily.  Psychiatric/Behavioral:  Negative for behavioral problems (Depression), sleep disturbance and suicidal ideas. The patient is not nervous/anxious.      Vital Signs: BP 138/88 Comment: 138/90  Pulse 76   Temp 98 F (36.7 C)   Resp 16   Ht 5' 4 (1.626 m)   Wt 287 lb (130.2 kg)   LMP 05/06/2018 (Approximate) Comment: neg  hcg  SpO2 99%   BMI 49.26 kg/m    Physical Exam Vitals and nursing note reviewed.  Constitutional:      General: She is not in acute distress.    Appearance: Normal appearance. She is well-developed. She is not diaphoretic.  HENT:     Head: Normocephalic and atraumatic.     Mouth/Throat:     Pharynx: Posterior oropharyngeal erythema present.   Eyes:     Pupils: Pupils are equal, round, and reactive to light.   Neck:     Thyroid: No thyromegaly.     Vascular: No JVD.     Trachea: No tracheal deviation.   Cardiovascular:     Rate and Rhythm: Normal rate and regular rhythm.     Heart sounds: Normal heart sounds. No murmur heard.    No friction rub. No gallop.  Pulmonary:     Effort: Pulmonary effort is normal. No respiratory distress.     Breath sounds: No wheezing or rales.  Chest:     Chest wall: No tenderness.  Abdominal:     Palpations: Abdomen is soft.     Tenderness: There is no abdominal tenderness.   Musculoskeletal:        General: Normal range of motion.   Skin:    General: Skin is warm and dry.   Neurological:     Mental Status: She is alert and oriented to person, place, and time.   Psychiatric:        Behavior: Behavior normal.        Thought Content: Thought content normal.        Judgment: Judgment normal.      LABS: Recent Results (from the past 2160 hours)  POCT HgB A1C     Status: None   Collection Time: 10/20/23  3:17 PM  Result Value Ref Range   Hemoglobin A1C 5.5 4.0 - 5.6 %   HbA1c POC (<> result, manual entry)     HbA1c, POC (prediabetic range)     HbA1c, POC (controlled diabetic range)    CBC w/Diff/Platelet     Status: None   Collection Time: 11/04/23  9:52 AM  Result Value Ref Range   WBC 7.8 3.4 - 10.8 x10E3/uL   RBC 4.14 3.77 - 5.28 x10E6/uL   Hemoglobin 12.2 11.1 - 15.9 g/dL   Hematocrit 16.1 09.6 - 46.6 %   MCV 91 79 - 97 fL   MCH 29.5 26.6 - 33.0 pg   MCHC 32.4 31.5 - 35.7 g/dL   RDW 04.5 40.9 - 81.1 %   Platelets  408 150 - 450 x10E3/uL   Neutrophils 58 Not Estab. %   Lymphs 31 Not Estab. %   Monocytes 6 Not Estab. %   Eos 4 Not Estab. %   Basos 1 Not Estab. %   Neutrophils Absolute 4.6 1.4 - 7.0 x10E3/uL   Lymphocytes Absolute 2.4 0.7 - 3.1 x10E3/uL   Monocytes Absolute 0.4 0.1 - 0.9 x10E3/uL   EOS (ABSOLUTE) 0.3 0.0 - 0.4 x10E3/uL   Basophils Absolute  0.1 0.0 - 0.2 x10E3/uL   Immature Granulocytes 0 Not Estab. %   Immature Grans (Abs) 0.0 0.0 - 0.1 x10E3/uL  Comprehensive metabolic panel with GFR     Status: Abnormal   Collection Time: 11/04/23  9:52 AM  Result Value Ref Range   Glucose 124 (H) 70 - 99 mg/dL   BUN 14 6 - 24 mg/dL   Creatinine, Ser 8.65 0.57 - 1.00 mg/dL   eGFR 784 >69 GE/XBM/8.41   BUN/Creatinine Ratio 21 9 - 23   Sodium 138 134 - 144 mmol/L   Potassium 3.5 3.5 - 5.2 mmol/L   Chloride 97 96 - 106 mmol/L   CO2 26 20 - 29 mmol/L   Calcium  9.3 8.7 - 10.2 mg/dL   Total Protein 7.2 6.0 - 8.5 g/dL   Albumin 4.0 3.9 - 4.9 g/dL   Globulin, Total 3.2 1.5 - 4.5 g/dL   Bilirubin Total 0.4 0.0 - 1.2 mg/dL   Alkaline Phosphatase 114 44 - 121 IU/L   AST 15 0 - 40 IU/L   ALT 12 0 - 32 IU/L  TSH + free T4     Status: None   Collection Time: 11/04/23  9:52 AM  Result Value Ref Range   TSH 1.900 0.450 - 4.500 uIU/mL   Free T4 1.01 0.82 - 1.77 ng/dL  Lipid Panel With LDL/HDL Ratio     Status: Abnormal   Collection Time: 11/04/23  9:52 AM  Result Value Ref Range   Cholesterol, Total 162 100 - 199 mg/dL   Triglycerides 324 (H) 0 - 149 mg/dL   HDL 46 >40 mg/dL   VLDL Cholesterol Cal 30 5 - 40 mg/dL   LDL Chol Calc (NIH) 86 0 - 99 mg/dL   LDL/HDL Ratio 1.9 0.0 - 3.2 ratio    Comment:                                     LDL/HDL Ratio                                             Men  Women                               1/2 Avg.Risk  1.0    1.5                                   Avg.Risk  3.6    3.2                                2X Avg.Risk  6.2    5.0                                 3X Avg.Risk  8.0    6.1         Assessment/Plan: 1. Encounter for general adult medical examination with abnormal findings (Primary) CPE performed, UTD on cologuard and eye exam. Will schedule mammogram  2. Type 2 diabetes mellitus with hyperglycemia, without long-term current use of insulin (  HCC) Well controlled, continue 500mg  metformin  BID and monitor  3. Essential hypertension Stable, continue current medication  4. Hyperlipidemia associated with type 2 diabetes mellitus (HCC) Stable, continue lipitor    General Counseling: Barbara Juarez verbalizes understanding of the findings of todays visit and agrees with plan of treatment. I have discussed any further diagnostic evaluation that may be needed or ordered today. We also reviewed her medications today. she has been encouraged to call the office with any questions or concerns that should arise related to todays visit.    Counseling:    No orders of the defined types were placed in this encounter.   No orders of the defined types were placed in this encounter.   This patient was seen by Taylor Favia, PA-C in collaboration with Dr. Verneta Gone as a part of collaborative care agreement.  Total time spent:35 Minutes  Time spent includes review of chart, medications, test results, and follow up plan with the patient.     Lawton Price, MD  Internal Medicine

## 2024-01-15 ENCOUNTER — Other Ambulatory Visit: Payer: Self-pay

## 2024-01-15 DIAGNOSIS — K219 Gastro-esophageal reflux disease without esophagitis: Secondary | ICD-10-CM

## 2024-01-15 MED ORDER — FAMOTIDINE 40 MG PO TABS: 40.0000 mg | ORAL_TABLET | Freq: Every day | ORAL | 1 refills | Status: DC

## 2024-01-20 ENCOUNTER — Other Ambulatory Visit: Payer: Self-pay

## 2024-01-20 MED ORDER — HYDROCHLOROTHIAZIDE 25 MG PO TABS: 25.0000 mg | ORAL_TABLET | Freq: Every day | ORAL | 0 refills | Status: DC

## 2024-02-25 ENCOUNTER — Other Ambulatory Visit: Payer: Self-pay

## 2024-02-25 DIAGNOSIS — E1165 Type 2 diabetes mellitus with hyperglycemia: Secondary | ICD-10-CM

## 2024-02-25 MED ORDER — METFORMIN HCL 500 MG PO TABS: 500.0000 mg | ORAL_TABLET | Freq: Two times a day (BID) | ORAL | 1 refills | Status: DC

## 2024-03-03 ENCOUNTER — Other Ambulatory Visit: Payer: Self-pay | Admitting: Physician Assistant

## 2024-03-03 DIAGNOSIS — E1165 Type 2 diabetes mellitus with hyperglycemia: Secondary | ICD-10-CM

## 2024-03-03 DIAGNOSIS — E1169 Type 2 diabetes mellitus with other specified complication: Secondary | ICD-10-CM

## 2024-03-25 ENCOUNTER — Ambulatory Visit: Admitting: Physician Assistant

## 2024-04-15 ENCOUNTER — Encounter: Payer: Self-pay | Admitting: Physician Assistant

## 2024-04-15 ENCOUNTER — Ambulatory Visit (INDEPENDENT_AMBULATORY_CARE_PROVIDER_SITE_OTHER): Admitting: Physician Assistant

## 2024-04-15 VITALS — BP 115/85 | HR 81 | Temp 98.3°F | Resp 16 | Ht 64.0 in | Wt 287.0 lb

## 2024-04-15 DIAGNOSIS — I10 Essential (primary) hypertension: Secondary | ICD-10-CM | POA: Diagnosis not present

## 2024-04-15 DIAGNOSIS — K219 Gastro-esophageal reflux disease without esophagitis: Secondary | ICD-10-CM

## 2024-04-15 DIAGNOSIS — E1165 Type 2 diabetes mellitus with hyperglycemia: Secondary | ICD-10-CM

## 2024-04-15 DIAGNOSIS — J302 Other seasonal allergic rhinitis: Secondary | ICD-10-CM

## 2024-04-15 LAB — POCT GLYCOSYLATED HEMOGLOBIN (HGB A1C): Hemoglobin A1C: 5.9 % — AB (ref 4.0–5.6)

## 2024-04-15 MED ORDER — HYDROCHLOROTHIAZIDE 25 MG PO TABS: 25.0000 mg | ORAL_TABLET | Freq: Every day | ORAL | 1 refills | Status: AC

## 2024-04-15 MED ORDER — LISINOPRIL 5 MG PO TABS: 5.0000 mg | ORAL_TABLET | Freq: Every day | ORAL | 1 refills | Status: AC

## 2024-04-15 MED ORDER — METFORMIN HCL 500 MG PO TABS: 500.0000 mg | ORAL_TABLET | Freq: Two times a day (BID) | ORAL | 1 refills | Status: AC

## 2024-04-15 MED ORDER — FAMOTIDINE 40 MG PO TABS: 40.0000 mg | ORAL_TABLET | Freq: Every day | ORAL | 1 refills | Status: AC

## 2024-04-15 MED ORDER — ACCU-CHEK GUIDE ME W/DEVICE KIT: PACK | 0 refills | Status: AC

## 2024-04-15 NOTE — Progress Notes (Signed)
 [facility] [address] [place], [place] [address]  Internal MEDICINE  Office Visit Note  Patient Name: Barbara Juarez  [id number]  [id number]  Date of Service: 04/15/2024  Chief Complaint  Patient presents with   Follow-up   Hypertension   Diabetes   Hyperlipidemia    HPI Pt is here for routine follow up -Taking 2 tablets of metformin  daily now instead of 3. Her meter broke so she has not been able to check regularly -Bp stable -Allergies worse right now and doesn't think zyrtec is helping, will switch to allegra -still needs to schedule mammogram -needs her med refills  Current Medication: Outpatient Encounter Medications as of 04/15/2024  Medication Sig   atorvastatin  (LIPITOR) 20 MG tablet TAKE 1 TABLET BY MOUTH DAILY   blood glucose meter kit and supplies KIT Dispense based on patient and insurance preference. Check fasting blood sugar once daily as directed.   Blood Glucose Monitoring Suppl (ACCU-CHEK GUIDE ME) w/Device KIT Use as directed. E11.65   brimonidine-timolol (COMBIGAN) 0.2-0.5 % ophthalmic solution Place 1 drop into both eyes every 12 (twelve) hours.   Difluprednate 0.05 % EMUL Apply 0.05 mLs to eye 2 times daily at 12 noon and 4 pm.   glucose blood test strip Use as instructed   Lancets (ACCU-CHEK MULTICLIX) lancets Use as instructed   [DISCONTINUED] famotidine  (PEPCID ) 40 MG tablet Take 1 tablet (40 mg total) by mouth daily.   [DISCONTINUED] hydrochlorothiazide  (HYDRODIURIL ) 25 MG tablet TAKE 1 TABLET BY MOUTH DAILY   [DISCONTINUED] lisinopril  (ZESTRIL ) 5 MG tablet Take 1 tablet (5 mg total) by mouth daily.   [DISCONTINUED] metFORMIN  (GLUCOPHAGE ) 500 MG tablet Take 1 tablet (500 mg total) by mouth daily with breakfast for 7 days, THEN 1 tablet (500 mg total) 2 (two) times daily with a meal.   [DISCONTINUED] metFORMIN  (GLUCOPHAGE ) 500 MG tablet Take 1 tablet (500 mg total) by mouth 2 (two) times daily with a meal.   famotidine  (PEPCID ) 40 MG  tablet Take 1 tablet (40 mg total) by mouth daily.   hydrochlorothiazide  (HYDRODIURIL ) 25 MG tablet Take 1 tablet (25 mg total) by mouth daily.   lisinopril  (ZESTRIL ) 5 MG tablet Take 1 tablet (5 mg total) by mouth daily.   metFORMIN  (GLUCOPHAGE ) 500 MG tablet Take 1 tablet (500 mg total) by mouth 2 (two) times daily with a meal.   No facility-administered encounter medications on file as of 04/15/2024.    Surgical History: Past Surgical History:  Procedure Laterality Date   ABDOMINAL HYSTERECTOMY  09/11/2018   CESAREAN SECTION  1999   CESAREAN SECTION  07/1991   DILATATION & CURETTAGE/HYSTEROSCOPY WITH MYOSURE N/A 05/21/2018   Procedure: DILATATION & CURETTAGE/HYSTEROSCOPY WITH MYOSURE ENDO CERVICAL POLYPECTOMY;  Surgeon: Leonce Garnette BIRCH, MD;  Location: [facility] OR[facility];  Service: Gynecology;  Laterality: N/A;    Medical History: Past Medical History:  Diagnosis Date   Diabetes mellitus without complication (HCC)    Fibroid uterus 05/20/2018   Fibroids    History of blood transfusion    Hyperlipidemia    Hypertension    Menorrhagia with irregular cycle 05/14/2018    Family History: Family History  Problem Relation Age of Onset   Diabetes Mother    Breast cancer Neg Hx     Social History   Socioeconomic History   Marital status: Married    Spouse name: Therapist, sports   Number of children: 2   Years of education: Not on file   Highest education level: Not on file  Occupational History   Not on file  Tobacco Use   Smoking status: Never   Smokeless tobacco: Never  Vaping Use   Vaping status: Never Used  Substance and Sexual Activity   Alcohol use: Yes    Alcohol/week: 1.0 standard drink of alcohol    Types: 1 Glasses of wine per week    Comment: once a month   Drug use: No   Sexual activity: Yes    Partners: Male    Birth control/protection: Surgical  Other Topics Concern   Not on file  Social History Narrative   Not on file   Social Drivers of Health   Financial  Resource Strain: Low Risk  (07/16/2018)   Overall Financial Resource Strain (CARDIA)    Difficulty of Paying Living Expenses: Not hard at all  Food Insecurity: No Food Insecurity (07/16/2018)   Hunger Vital Sign    Worried About Running Out of Food in the Last Year: Never true    Ran Out of Food in the Last Year: Never true  Transportation Needs: No Transportation Needs (07/16/2018)   PRAPARE - Administrator, Civil Service (Medical): No    Lack of Transportation (Non-Medical): No  Physical Activity: Sufficiently Active (07/16/2018)   Exercise Vital Sign    Days of Exercise per Week: 5 days    Minutes of Exercise per Session: 30 min  Stress: No Stress Concern Present (07/16/2018)   [employer] of Occupational Health - Occupational Stress Questionnaire    Feeling of Stress : Not at all  Social Connections: Moderately Integrated (07/16/2018)   Social Connection and Isolation Panel    Frequency of Communication with Friends and Family: More than three times a week    Frequency of Social Gatherings with Friends and Family: More than three times a week    Attends Religious Services: More than 4 times per year    Active Member of [employer] or Organizations: No    Attends Banker Meetings: Never    Marital Status: Married  Catering manager Violence: Not At Risk (07/16/2018)   Humiliation, Afraid, Rape, and Kick questionnaire    Fear of Current or Ex-Partner: No    Emotionally Abused: No    Physically Abused: No    Sexually Abused: No      Review of Systems  Constitutional:  Negative for chills and unexpected weight change.  HENT:  Positive for postnasal drip. Negative for congestion, rhinorrhea, sneezing and sore throat.   Respiratory:  Negative for cough, chest tightness and shortness of breath.   Cardiovascular:  Negative for chest pain and palpitations.  Gastrointestinal:  Negative for abdominal pain, constipation, diarrhea, nausea and vomiting.   Genitourinary:  Negative for dysuria and frequency.  Musculoskeletal:  Negative for arthralgias, back pain, joint swelling and neck pain.  Skin:  Negative for rash.  Allergic/Immunologic: Positive for environmental allergies.  Neurological: Negative.  Negative for tremors and numbness.  Hematological:  Negative for adenopathy. Does not bruise/bleed easily.  Psychiatric/Behavioral:  Negative for behavioral problems (Depression), sleep disturbance and suicidal ideas. The patient is not nervous/anxious.     Vital Signs: BP 115/85   Pulse 81   Temp 98.3 F (36.8 C)   Resp 16   Ht 5' 4 (1.626 m)   Wt 287 lb (130.2 kg)   LMP 05/06/2018 (Approximate) Comment: neg hcg  SpO2 99%   BMI 49.26 kg/m    Physical Exam Vitals and nursing note reviewed.  Constitutional:  General: She is not in acute distress.    Appearance: Normal appearance. She is well-developed. She is not diaphoretic.  HENT:     Head: Normocephalic and atraumatic.  Eyes:     Extraocular Movements: Extraocular movements intact.  Neck:     Thyroid: No thyromegaly.     Vascular: No JVD.     Trachea: No tracheal deviation.  Cardiovascular:     Rate and Rhythm: Normal rate and regular rhythm.     Heart sounds: Normal heart sounds. No murmur heard.    No friction rub. No gallop.  Pulmonary:     Effort: Pulmonary effort is normal. No respiratory distress.     Breath sounds: No wheezing or rales.  Chest:     Chest wall: No tenderness.  Musculoskeletal:        General: Normal range of motion.  Skin:    General: Skin is warm and dry.  Neurological:     Mental Status: She is alert.  Psychiatric:        Behavior: Behavior normal.        Thought Content: Thought content normal.        Judgment: Judgment normal.        Assessment/Plan: 1. Type 2 diabetes mellitus with hyperglycemia, without long-term current use of insulin (HCC) (Primary) - POCT HgB A1C is 5.9 which is up from 5.5 last visit but still very  well-controlled on lower dose of metformin  and will continue - Urine Microalbumin w/creat. ratio - metFORMIN  (GLUCOPHAGE ) 500 MG tablet; Take 1 tablet (500 mg total) by mouth 2 (two) times daily with a meal.  Dispense: 180 tablet; Refill: 1 - Blood Glucose Monitoring Suppl (ACCU-CHEK GUIDE ME) w/Device KIT; Use as directed. E11.65  Dispense: 1 kit; Refill: 0  2. Essential hypertension Very well-controlled continue current medication - hydrochlorothiazide  (HYDRODIURIL ) 25 MG tablet; Take 1 tablet (25 mg total) by mouth daily.  Dispense: 90 tablet; Refill: 1 - lisinopril  (ZESTRIL ) 5 MG tablet; Take 1 tablet (5 mg total) by mouth daily.  Dispense: 90 tablet; Refill: 1  3. Gastroesophageal reflux disease without esophagitis - famotidine  (PEPCID ) 40 MG tablet; Take 1 tablet (40 mg total) by mouth daily.  Dispense: 90 tablet; Refill: 1  4. Seasonal allergic rhinitis, unspecified trigger Will switch from Zyrtec to Allegra   General Counseling: Steen verbalizes understanding of the findings of todays visit and agrees with plan of treatment. I have discussed any further diagnostic evaluation that may be needed or ordered today. We also reviewed her medications today. she has been encouraged to call the office with any questions or concerns that should arise related to todays visit.    Orders Placed This Encounter  Procedures   Urine Microalbumin w/creat. ratio   POCT HgB A1C    Meds ordered this encounter  Medications   hydrochlorothiazide  (HYDRODIURIL ) 25 MG tablet    Sig: Take 1 tablet (25 mg total) by mouth daily.    Dispense:  90 tablet    Refill:  1   lisinopril  (ZESTRIL ) 5 MG tablet    Sig: Take 1 tablet (5 mg total) by mouth daily.    Dispense:  90 tablet    Refill:  1   metFORMIN  (GLUCOPHAGE ) 500 MG tablet    Sig: Take 1 tablet (500 mg total) by mouth 2 (two) times daily with a meal.    Dispense:  180 tablet    Refill:  1   famotidine  (PEPCID ) 40 MG tablet    Sig:  Take 1  tablet (40 mg total) by mouth daily.    Dispense:  90 tablet    Refill:  1   Blood Glucose Monitoring Suppl (ACCU-CHEK GUIDE ME) w/Device KIT    Sig: Use as directed. E11.65    Dispense:  1 kit    Refill:  0    This patient was seen by Tinnie Pro, PA-C in collaboration with Dr. Sigrid Bathe as a part of collaborative care agreement.   Total time spent:30 Minutes Time spent includes review of chart, medications, test results, and follow up plan with the patient.      Dr Fozia M Khan Internal medicine

## 2024-04-16 LAB — MICROALBUMIN / CREATININE URINE RATIO
Creatinine, Urine: 56.9 mg/dL
Microalb/Creat Ratio: <5 mg/g{creat} (ref 0–29)
Microalbumin, Urine: <3 ug/mL

## 2024-04-17 ENCOUNTER — Other Ambulatory Visit: Payer: Self-pay | Admitting: Physician Assistant

## 2024-04-17 DIAGNOSIS — I10 Essential (primary) hypertension: Secondary | ICD-10-CM

## 2024-05-24 LAB — HM DIABETES EYE EXAM

## 2024-08-16 ENCOUNTER — Ambulatory Visit: Admitting: Physician Assistant

## 2024-08-19 ENCOUNTER — Ambulatory Visit: Admitting: Physician Assistant

## 2024-09-24 ENCOUNTER — Other Ambulatory Visit: Payer: Self-pay

## 2024-10-01 ENCOUNTER — Telehealth: Payer: Self-pay | Admitting: Oncology

## 2024-10-08 ENCOUNTER — Inpatient Hospital Stay: Payer: Self-pay

## 2024-10-15 ENCOUNTER — Inpatient Hospital Stay: Payer: Self-pay | Admitting: Oncology

## 2024-11-25 ENCOUNTER — Encounter: Admitting: Physician Assistant
